# Patient Record
Sex: Female | Born: 1950
Health system: Southern US, Community
[De-identification: ages and names within clinical notes are randomized; demographics above are authoritative.]

## PROBLEM LIST (undated history)

## (undated) DIAGNOSIS — M810 Age-related osteoporosis without current pathological fracture: Secondary | ICD-10-CM

## (undated) DIAGNOSIS — Z973 Presence of spectacles and contact lenses: Secondary | ICD-10-CM

## (undated) DIAGNOSIS — F419 Anxiety disorder, unspecified: Secondary | ICD-10-CM

## (undated) DIAGNOSIS — C50411 Malignant neoplasm of upper-outer quadrant of right female breast: Secondary | ICD-10-CM

## (undated) DIAGNOSIS — I10 Essential (primary) hypertension: Secondary | ICD-10-CM

## (undated) DIAGNOSIS — N950 Postmenopausal bleeding: Secondary | ICD-10-CM

## (undated) HISTORY — DX: Age-related osteoporosis without current pathological fracture: M81.0

---

## 1997-11-04 ENCOUNTER — Other Ambulatory Visit: Admission: RE | Admit: 1997-11-04 | Discharge: 1997-11-04 | Payer: Self-pay | Admitting: Family Medicine

## 2001-11-05 ENCOUNTER — Encounter: Payer: Self-pay | Admitting: Internal Medicine

## 2001-11-05 ENCOUNTER — Encounter: Admission: RE | Admit: 2001-11-05 | Discharge: 2001-11-05 | Payer: Self-pay | Admitting: Internal Medicine

## 2003-09-11 ENCOUNTER — Ambulatory Visit (HOSPITAL_COMMUNITY): Admission: RE | Admit: 2003-09-11 | Discharge: 2003-09-11 | Payer: Self-pay | Admitting: *Deleted

## 2009-09-14 ENCOUNTER — Other Ambulatory Visit: Admission: RE | Admit: 2009-09-14 | Discharge: 2009-09-14 | Payer: Self-pay | Admitting: Registered Nurse

## 2009-09-18 ENCOUNTER — Encounter: Admission: RE | Admit: 2009-09-18 | Discharge: 2009-09-18 | Payer: Self-pay | Admitting: Internal Medicine

## 2010-01-24 ENCOUNTER — Encounter: Payer: Self-pay | Admitting: Internal Medicine

## 2010-02-26 ENCOUNTER — Other Ambulatory Visit: Payer: Self-pay | Admitting: Internal Medicine

## 2010-03-26 ENCOUNTER — Other Ambulatory Visit: Payer: Self-pay

## 2010-04-02 ENCOUNTER — Ambulatory Visit
Admission: RE | Admit: 2010-04-02 | Discharge: 2010-04-02 | Disposition: A | Discharge status: Home / Self Care | Payer: PRIVATE HEALTH INSURANCE | Source: Ambulatory Visit | Attending: Internal Medicine | Admitting: Internal Medicine

## 2010-04-07 ENCOUNTER — Other Ambulatory Visit: Payer: Self-pay | Admitting: Internal Medicine

## 2010-04-07 DIAGNOSIS — N281 Cyst of kidney, acquired: Secondary | ICD-10-CM

## 2010-04-26 ENCOUNTER — Other Ambulatory Visit: Payer: PRIVATE HEALTH INSURANCE

## 2010-05-21 NOTE — Op Note (Signed)
NAME:  Breanna Nelson, DIVEN NO.:  [id number]   MEDICAL RECORD NO.:  [id number]                   PATIENT TYPE:  AMB   LOCATION:  ENDO                                 FACILITY:  [facility]   PHYSICIAN:  Georgiana Spinner, M.D.                 DATE OF BIRTH:  1950/06/07   DATE OF PROCEDURE:  09/11/2003  DATE OF DISCHARGE:                                 OPERATIVE REPORT   PROCEDURE:  Colonoscopy in a patient with hemoccult positivity.   ANESTHESIA:  Demerol 60, Versed 6 mg.   DESCRIPTION OF PROCEDURE:  With the patient mildly sedated in the left  lateral decubitus position, the Olympus videoscopic colonoscope was inserted  in the rectum and passed under direct vision to the cecum, identified by the  ileocecal valve and appendiceal orifice, both of which were photographed.  From this point, the colonoscope was slowly withdrawn taking circumferential  views of the colonic mucosa stopping only in the rectum which appeared  normal on direct and showed hemorrhoids on retroflexed view which may have  been the cause of the patient's positivity. The endoscope was straightened  and withdrawn.  The patient's vital signs and pulse oximetry remained  stable.  The patient tolerated the procedure well without apparent  complications.   FINDINGS:  Internal hemorrhoids, otherwise unremarkable examination.   PLAN:  Have the patient follow up with me as an outpatient.                                               Georgiana Spinner, M.D.    GMO/MEDQ  D:  09/11/2003  T:  09/11/2003  Job:  [id number]

## 2013-05-07 ENCOUNTER — Other Ambulatory Visit: Payer: Self-pay | Admitting: Radiology

## 2013-05-07 DIAGNOSIS — D0511 Intraductal carcinoma in situ of right breast: Secondary | ICD-10-CM

## 2013-05-10 ENCOUNTER — Telehealth: Payer: Self-pay | Admitting: *Deleted

## 2013-05-10 DIAGNOSIS — C50411 Malignant neoplasm of upper-outer quadrant of right female breast: Secondary | ICD-10-CM

## 2013-05-10 DIAGNOSIS — Z170 Estrogen receptor positive status [ER+]: Secondary | ICD-10-CM | POA: Insufficient documentation

## 2013-05-10 NOTE — Telephone Encounter (Signed)
Confirmed [facility] for 05/15/13 at 12N .  Instructions and contact information given.

## 2013-05-13 ENCOUNTER — Ambulatory Visit
Admission: RE | Admit: 2013-05-13 | Discharge: 2013-05-13 | Disposition: A | Discharge status: Home / Self Care | Payer: PRIVATE HEALTH INSURANCE | Source: Ambulatory Visit | Attending: Radiology | Admitting: Radiology

## 2013-05-13 ENCOUNTER — Telehealth: Payer: Self-pay | Admitting: *Deleted

## 2013-05-13 ENCOUNTER — Encounter (HOSPITAL_COMMUNITY): Payer: Self-pay

## 2013-05-13 DIAGNOSIS — D0511 Intraductal carcinoma in situ of right breast: Secondary | ICD-10-CM

## 2013-05-13 MED ORDER — GADOBENATE DIMEGLUMINE 529 MG/ML IV SOLN
11.0000 mL | Freq: Once | INTRAVENOUS | Status: AC | PRN
  Administered 2013-05-13: 11 mL via INTRAVENOUS

## 2013-05-13 NOTE — Telephone Encounter (Signed)
Called and spoke with patient and confirmed new appointment time of 1230pm.  Informed her she would see the surgeon and radiation oncologist and then the medical oncologist at a later date.  She informed me she has [employer] and was just informed that this appointment is out of network.  Explained I would look into this and call her back.

## 2013-05-15 ENCOUNTER — Encounter: Payer: Self-pay | Admitting: Radiation Oncology

## 2013-05-15 ENCOUNTER — Encounter: Payer: Self-pay | Admitting: *Deleted

## 2013-05-15 ENCOUNTER — Ambulatory Visit: Payer: PRIVATE HEALTH INSURANCE

## 2013-05-15 ENCOUNTER — Ambulatory Visit
Admission: RE | Admit: 2013-05-15 | Discharge: 2013-05-15 | Disposition: A | Discharge status: Home / Self Care | Payer: PRIVATE HEALTH INSURANCE | Source: Ambulatory Visit | Attending: Radiation Oncology | Admitting: Radiation Oncology

## 2013-05-15 ENCOUNTER — Ambulatory Visit (HOSPITAL_BASED_OUTPATIENT_CLINIC_OR_DEPARTMENT_OTHER): Payer: 59 | Admitting: Surgery

## 2013-05-15 ENCOUNTER — Encounter (INDEPENDENT_AMBULATORY_CARE_PROVIDER_SITE_OTHER): Payer: Self-pay | Admitting: Surgery

## 2013-05-15 ENCOUNTER — Other Ambulatory Visit: Payer: PRIVATE HEALTH INSURANCE

## 2013-05-15 DIAGNOSIS — C50919 Malignant neoplasm of unspecified site of unspecified female breast: Secondary | ICD-10-CM

## 2013-05-15 DIAGNOSIS — C50411 Malignant neoplasm of upper-outer quadrant of right female breast: Secondary | ICD-10-CM

## 2013-05-15 DIAGNOSIS — C50911 Malignant neoplasm of unspecified site of right female breast: Secondary | ICD-10-CM

## 2013-05-15 NOTE — Patient Instructions (Signed)
Office will call to set up surgery Sentinel Lymph Node Biopsy Sentinel lymph node biopsy is a procedure in which a single lymph node is identified, removed, and examined for cancer. Lymph nodes are collections of tissue that help filter infections, cancer cells, and other waste substances from the bloodstream. Certain types of cancer can spread to nearby lymph nodes. The cancer spreads to one lymph node first, and then to others. The first lymph node that your cancer could spread to is called the sentinel lymph node. Examining the sentinel lymph node for cancer can help your caregiver plan future treatment for you. LET YOUR CAREGIVER KNOW ABOUT:   Allergies to food or medicine.  Medicines taken, including vitamins, herbs, eyedrops, over-the-counter medicines, and creams.  Use of steroids (by mouth or creams).  Previous problems with numbing medicines.  History of bleeding problems or blood clots.  Previous surgery.  Other health problems, including diabetes and kidney problems.  Possibility of pregnancy, if this applies. RISKS AND COMPLICATIONS   Infection.  Bleeding.  Allergic reaction to the dye used for the procedure.  Blue staining of the skin where the dye is injected.  Damaged lymph vessels, causing a buildup of fluid (lymphedema).  Pain or bruising at the biopsy site. BEFORE THE PROCEDURE   Stop smoking at least 2 weeks before the procedure. Not smoking will improve your health after the procedure and decrease the chance of getting a wound infection.  You may have blood tests to make sure your blood clots normally.  Ask your caregiver about changing or stopping your regular medicines.  Do not eat or drink anything for 8 hours before the procedure. PROCEDURE   You will be given medicine that makes you sleep (general anesthetic).  A blue, radioactive dye will be injected near the tumor. The dye will then spread into the sentinel lymph node.  A scanner will  identify the sentinel lymph node.  A small cut (incision) will be made, and the sentinel lymph node will be removed.  The sentinel lymph node will be examined in a lab. Sometimes, a sentinel lymph node biopsy is performed during another surgery, such as a mastectomy or lumpectomy for breast cancer.  AFTER THE PROCEDURE   You will go to a recovery room.  You will be monitored for several hours.  If complications do not occur, you will be allowed to go home a few hours after the procedure.  Your urine may be blue for the next 24 hours. This is normal. It is caused by the dye used during the procedure.  Your skin where the dye was injected may be blue for up to 8 weeks. Document Released: 03/14/2011 Document Reviewed: 03/14/2011 [facility] Patient Information 2014 [place].  Lumpectomy A lumpectomy is a form of "breast conserving" or "breast preservation" surgery. It may also be referred to as a partial mastectomy. During a lumpectomy, the portion of the breast that contains the cancerous tumor or breast mass (the lump) is removed. Some normal tissue around the lump may also be removed to make sure all the tumor has been removed. This surgery should take 40 minutes or less. LET [facility] CARE PROVIDER KNOW ABOUT:  Any allergies you have.  All medicines you are taking, including vitamins, herbs, eye drops, creams, and over-the-counter medicines.  Previous problems you or members of your family have had with the use of anesthetics.  Any blood disorders you have.  Previous surgeries you have had.  Medical conditions you have. RISKS  AND COMPLICATIONS Generally, this is a safe procedure. However, as with any procedure, complications can occur. Possible complications include:  Bleeding.  Infection.  Pain.  Temporary swelling.  Change in the shape of the breast, particularly if a large portion is removed. BEFORE THE PROCEDURE  Ask your health care provider about changing  or stopping your regular medicines.  Do not eat or drink anything for 7 8 hours before the surgery or as directed by your health care provider. Ask your health care provider if you can take a sip of water with any approved medicines.  On the day of surgery, your healthcare provider will use a mammogram or ultrasound to locate and mark the tumor in your breast. These markings on your breast will show where the cut (incision) will be made. PROCEDURE   An IV tube will be put into one of your veins.  You may be given medicine to help you relax before the surgery (sedative). You will be given one of the following:  A medicine that numbs the area (local anesthesia).  A medicine that makes you go to sleep (general anesthesia).  Your health care provider will use a kind of electric scalpel that uses heat to minimize bleeding (electrocautery knife).  A curved incision (like a smile or frown) that follows the natural curve of your breast is made, to allow for minimal scarring and better healing.  The tumor will be removed with some of the surrounding tissue. This will be sent to the lab for analysis. Your health care provider may also remove your lymph nodes at this time if needed.  Sometimes, but not always, a rubber tube called a drain will be surgically inserted into your breast area or armpit to collect excess fluid that may accumulate in the space where the tumor was. This drain is connected to a plastic bulb on the outside of your body. This drain creates suction to help remove the fluid.  The incisions will be closed with stitches (sutures).  A bandage may be placed over the incisions. AFTER THE PROCEDURE  You will be taken to the recovery area.  You will be given medicine for pain.  A small rubber drain may be placed in the breast for 2 3 days to prevent a collection of blood (hematoma) from developing in the breast. You will be given instructions on caring for the drain before you go  home.  A pressure bandage (dressing) will be applied for 1 2 days to prevent bleeding. Ask your health care provider how to care for your bandage at home. Document Released: 01/31/2006 Document Revised: 08/22/2012 Document Reviewed: 05/25/2012 [facility] Patient Information 2014 [place].

## 2013-05-15 NOTE — Progress Notes (Signed)
Patient ID: Breanna Nelson, female   DOB: February 22, 1950, 62 y.o.   MRN: [id number]  No chief complaint on file.   HPI Breanna Nelson is a 63 y.o. female.  Pt sent at the request of Dr Breanna Nelson for right breast cancer.  Pt found to have mammographic abnormality on mammogram and core BX showed IDC/DCIS 1.8 cm maximal diameter. ER/PR positive her 2 neu negative. HPI  Past Medical History  Diagnosis Date  . Osteoporosis     History reviewed. No pertinent past surgical history.  Family History  Problem Relation Age of Onset  . Testicular cancer Brother     Social History History  Substance Use Topics  . Smoking status: Never Smoker   . Smokeless tobacco: Not on file  . Alcohol Use: No    No Known Allergies  No current outpatient prescriptions on file.   No current facility-administered medications for this visit.    Review of Systems Review of Systems  Constitutional: Negative for fever, chills and unexpected weight change.  HENT: Negative for congestion, hearing loss, sore throat, trouble swallowing and voice change.   Eyes: Negative for visual disturbance.  Respiratory: Negative for cough and wheezing.   Cardiovascular: Negative for chest pain, palpitations and leg swelling.  Gastrointestinal: Negative for nausea, vomiting, abdominal pain, diarrhea, constipation, blood in stool, abdominal distention and anal bleeding.  Genitourinary: Negative for hematuria, vaginal bleeding and difficulty urinating.  Musculoskeletal: Negative for arthralgias.  Skin: Negative for rash and wound.  Neurological: Negative for seizures, syncope and headaches.  Hematological: Negative for adenopathy. Does not bruise/bleed easily.  Psychiatric/Behavioral: Negative for confusion.    There were no vitals taken for this visit.  Physical Exam Physical Exam  Constitutional: [name], place, and time. She appears well-developed and well-nourished.  HENT:  Head: Normocephalic.    Mouth/Throat: No oropharyngeal exudate.  Eyes: Pupils are equal, round, and reactive to light. No scleral icterus.  Neck: Normal range of motion. Neck supple.  Cardiovascular: Normal rate and regular rhythm.   Pulmonary/Chest: Effort normal and breath sounds normal. Right breast exhibits no inverted nipple, no mass, no nipple discharge, no skin change and no tenderness. Left breast exhibits no inverted nipple, no mass, no nipple discharge, no skin change and no tenderness. Breasts are symmetrical.    Musculoskeletal: Normal range of motion.  Lymphadenopathy:    She has no cervical adenopathy.  Neurological: She is alert and oriented to person, place, and time.  Skin: Skin is warm and dry.  Psychiatric: She has a normal mood and affect. Her behavior is normal. Judgment and thought content normal.    Data Reviewed CLINICAL DATA: New diagnosis of invasive mammary carcinoma in the  10 o'clock location of the right breast on ultrasound-guided core  biopsy of a palpable mass.  LABS: None BUN and creatinine were obtained on site at [facility] at [address]Results: BUN 9 mg/dL, Creatinine 0.7  mg/dL.  EXAM:  BILATERAL BREAST MRI WITH AND WITHOUT CONTRAST  TECHNIQUE:  Multiplanar, multisequence MR images of both breasts were obtained  prior to and following the intravenous administration of 47ml of  MultiHance.  THREE-DIMENSIONAL MR IMAGE RENDERING ON INDEPENDENT WORKSTATION:  Three-dimensional MR images were rendered by post-processing of the  original MR data on an independent workstation. The  three-dimensional MR images were interpreted, and findings are  reported in the following complete MRI report for this study. Three  dimensional images were evaluated at the independent DynaCad  workstation  COMPARISON: Previous exams from [facility]:  Breast composition: d. Extreme fibroglandular tissue.  Background parenchymal enhancement: Marked   Right breast: Within the upper-outer quadrant of the right breast,  there are several small nodules of enhancement associated with small  post biopsy hematoma and clip. The most discrete nodule shows rapid  wash-in and washout type enhancement. The area of enhancement and  hematoma together measure 1.8 x 1.7 x 1.3 cm.  Left breast: No mass or abnormal enhancement.  Lymph nodes: No suspicious lymph nodes identified.  Ancillary findings: None.  IMPRESSION:  1. Small area of enhancement in the upper-outer quadrant of the  right breast associated with the area of known malignancy.  2. No suspicious abnormality identified in the left breast.  RECOMMENDATION:  Treatment plan is recommended.  BI-RADS CATEGORY 6: Known biopsy-proven malignancy.  Electronically Signed  By: Shon Hale M.D.  On: 05/13/2013 12:54   Assessment    Stage 1 right breast cancer ER/PR positive her 2 neu negative    Plan    Pt desires breast conservation for her stage 1 right breast cancer.Discussed seed localized lumpectomy and SLN mapping.  Discussed mastectomy and reconstruction.The procedure has been discussed with the patient. Alternatives to surgery have been discussed with the patient.  Risks of surgery include bleeding,  Infection,  Seroma formation, death,  and the need for further surgery.   The patient understands and wishes to proceed.Sentinel lymph node mapping and dissection has been discussed with the patient.  Risk of bleeding,  Infection,  Seroma formation,  Additional procedures,,  Shoulder weakness ,  Shoulder stiffness,  Nerve and blood vessel injury and reaction to the mapping dyes have been discussed.  Alternatives to surgery have been discussed with the patient.  The patient agrees to proceed.       Breanna Nelson A. Rochel Privett 05/15/2013, 2:31 PM

## 2013-05-15 NOTE — Progress Notes (Signed)
Breanna Nelson Psychosocial Distress Screening Clinical Social Work  Clinical Social Work was referred by distress screening protocol.  The patient scored a 5 on the Psychosocial Distress Thermometer which indicates moderate distress. Clinical Social Worker met with patient in [facility] to assess for distress and other psychosocial needs.  Patient was accompanied by her daughter.  She states she was very distressed prior to meeting with multidisciplinary team, but feels much better after discussing treatment team.  CSW reviewed information support programs and encouraged patient to utilize [employer], breast cancer support groups, yoga/tai chi, and support services.  Patient is interested in programs but would like to process options at this time.  CSW encouraged patient or daughter to call with any additional questions or concerns.   Clinical Social Worker follow up needed: no  If yes, follow up plan:   Polo Riley, MSW, LCSW, OSW-C Clinical Social Worker [facility] [phone]

## 2013-05-15 NOTE — Progress Notes (Signed)
Radiation Oncology         [phone]) [phone] ________________________________  Initial outpatient Consultation  Name: Breanna Nelson MRN: [id number]  Date: 05/15/2013  DOB: Jun 03, 1950  JE:HUDJ,SHFWYOV, MD  Cornett, Joyice Faster., MD   REFERRING PHYSICIAN: Cornett, Joyice Faster., MD  DIAGNOSIS: Breast cancer of upper-outer quadrant of right female breast   Primary site: Breast (Right)   Staging method: AJCC 7th Edition   Clinical: Stage IA (T1c, N0, cM0)   Summary: Stage IA (T1c, N0, cM0)   Clinical comments: Stage at breast conference 05/15/13  HISTORY OF PRESENT ILLNESS::Breanna Nelson is a 63 y.o. female who is seen out courtesy of Dr. [name] as part of the multidisciplinary breast clinic. Earlier this year on self examination the patient palpated a nodule in the upper-outer aspect of her right breast. She was seen by her primary care physician and this area was not palpable however on imaging studies architectural distortion was noted in the upper outer quadrant of the right breast. On ultrasound there was a 1 cm irregular mass in the 10:00 position of the right breast. The patient proceeded to undergo biopsy of this area with pathology returning invasive mammary carcinoma likely low grade. There was some associated in situ carcinoma. Tumor was HER-2/neu negative estrogen and progesterone receptors positive. Subsequent MRI of the of the breast area showed a small area of enhancement in the upper outer quadrant of the right breast with questionable small nodules in the surrounding area of enhancement.  With this information the patient is now seen in the multidisciplinary breast clinic.Marland Kitchen  PREVIOUS RADIATION THERAPY: No  PAST MEDICAL HISTORY:  has a past medical history of Osteoporosis.    PAST SURGICAL HISTORY:History reviewed. No pertinent past surgical history.  FAMILY HISTORY: family history includes Testicular cancer in her brother.  SOCIAL HISTORY:  reports that she has never smoked.  She does not have any smokeless tobacco history on file. She reports that she does not drink alcohol or use illicit drugs. she lives by herself in the [place] area. Patient cleans houses on a part-time basis. She previously worked with Erlene Quan years ago in  [place].   she is accompanied by her daughter on evaluation today.  ALLERGIES: Review of patient's allergies indicates no known allergies.  MEDICATIONS: The patient is on no prescription medications. She does take vitamin C calcium and D3 supplement No current outpatient prescriptions on file.   No current facility-administered medications for this encounter.    REVIEW OF SYSTEMS:  A 15 point review of systems is documented in the electronic medical record. This was obtained by the nursing staff. However, I reviewed this with the patient to discuss relevant findings and make appropriate changes.  Patient palpated the area of concern on self examination. She denies any actual pain nipple discharge or bleeding prior to biopsy.   she denies any new bony pain headaches dizziness or blurred vision.  PHYSICAL EXAM: Gen. this is a very pleasant 63 year old female in no acute distress.  examination of the oral cavity reveals no mucosal lesion. The teeth are in good repair. Examination of the neck and supraclavicular region reveals no evidence of adenopathy. The axillary areas are free of adenopathy. Examination of the lungs reveals them to be clear. The heart has regular rhythm and rate. Summation of the left breast reveals no mass or nipple discharge. Examination of the right breast reveals some bruising in the upper-outer quadrant. No dominant mass appreciated in the breast nipple discharge or bleeding.  ECOG = 0  0 - Asymptomatic (Fully active, able to carry on all predisease activities without restriction)  1 - Symptomatic but completely ambulatory (Restricted in physically strenuous activity but ambulatory and able to carry out work of a  light or sedentary nature. For example, light housework, office work)  2 - Symptomatic, <50% in bed during the day (Ambulatory and capable of all self care but unable to carry out any work activities. Up and about more than 50% of waking hours)  3 - Symptomatic, >50% in bed, but not bedbound (Capable of only limited self-care, confined to bed or chair 50% or more of waking hours)  4 - Bedbound (Completely disabled. Cannot carry on any self-care. Totally confined to bed or chair)  5 - Death   Eustace Pen MM, Creech RH, Tormey DC, et al. [phone]). "Toxicity and response criteria of the [facility] Group". [place] Oncol. 5 (6): 649-55  LABORATORY DATA:  No results found for this basename: WBC, HGB, HCT, MCV, PLT, NEUTROABS   No results found for this basename: NA, K, CL, CO2, GLUCOSE,  BUN, CREATININE, CALCIUM      RADIOGRAPHY: Mr Breast Bilateral W Wo Contrast  05/13/2013   CLINICAL DATA:  New diagnosis of invasive mammary carcinoma in the 10 o'clock location of the right breast on ultrasound-guided core biopsy of a palpable mass.  LABS:  None BUN and creatinine were obtained on site at [facility] at [address]Results: BUN 9 mg/dL, Creatinine 0.7 mg/dL.  EXAM: BILATERAL BREAST MRI WITH AND WITHOUT CONTRAST  TECHNIQUE: Multiplanar, multisequence MR images of both breasts were obtained prior to and following the intravenous administration of 45m of MultiHance.  THREE-DIMENSIONAL MR IMAGE RENDERING ON INDEPENDENT WORKSTATION:  Three-dimensional MR images were rendered by post-processing of the original MR data on an independent workstation. The three-dimensional MR images were interpreted, and findings are reported in the following complete MRI report for this study. Three dimensional images were evaluated at the independent DynaCad workstation  COMPARISON:  Previous exams from [place] Breast composition: d.  Extreme fibroglandular tissue.   Background parenchymal enhancement: Marked  Right breast: Within the upper-outer quadrant of the right breast, there are several small nodules of enhancement associated with small post biopsy hematoma and clip. The most discrete nodule shows rapid wash-in and washout type enhancement. The area of enhancement and hematoma together measure 1.8 x 1.7 x 1.3 cm.  Left breast: No mass or abnormal enhancement.  Lymph nodes: No suspicious lymph nodes identified.  Ancillary findings:  None.  IMPRESSION: 1. Small area of enhancement in the upper-outer quadrant of the right breast associated with the area of known malignancy. 2. No suspicious abnormality identified in the left breast.  RECOMMENDATION: Treatment plan is recommended.  BI-RADS CATEGORY  6: Known biopsy-proven malignancy.   Electronically Signed   By: BShon HaleM.D.   On: 05/13/2013 12:54      IMPRESSION: Breast cancer of upper-outer quadrant of right female breast    Primary site: Breast (Right)   Staging method: AJCC 7th Edition   Clinical: Stage IA (T1c, N0, cM0)   Summary: Stage IA (T1c, N0, cM0)    The patient's tumor appears to be low-grade on initial biopsy, ER/PR positive, HER-2/neu negative. She would be an excellent candidate for breast conservation therapy with lumpectomy, sentinel node procedure and radiation therapy. I briefly discussed mastectomy for local regional management but  the patient at this time is interested in  breast conservation therapy. I discussed treatment course side effects and potential toxicities of radiation therapy in this situation with the patient and her daughter. She appears to understand and wishes to proceed with planned course of radiation treatment. She appears to be leaning towards her treatment here in [place] rather than [place].  She would appear to be a candidate for hypofractionated accelerated course of treatment.   PLAN: Patient will meet with Gen. surgery later this afternoon and will likely  proceed with planning for her breast conservation therapy.  I spent 45 minutes minutes face to face with the patient and more than 50% of that time was spent in counseling and/or coordination of care.   ------------------------------------------------  Blair Promise, PhD, MD

## 2013-05-16 ENCOUNTER — Telehealth (INDEPENDENT_AMBULATORY_CARE_PROVIDER_SITE_OTHER): Payer: Self-pay

## 2013-05-16 NOTE — Telephone Encounter (Signed)
Called pt to let her know that Dr Brantley Stage would be calling the insurance company  on Friday. Pt verbalized understanding and was very grateful for everyone's help

## 2013-05-16 NOTE — Telephone Encounter (Signed)
Dr Brantley Stage had asked the pt to call her insurance to file a appeal because he is not in network for pts insurance. They have called insurance to file an appeal and they informed her that Dr Brantley Stage would need to call and file this appeal and then everything should go through. Dr Brantley Stage would need to call Clinical Intake Team [phone]. Pts family would like someone to call her to let them know if this is something that Dr Brantley Stage would/could do so they will know how they need to proceed with surgery plans.

## 2013-05-16 NOTE — Telephone Encounter (Signed)
Will call friday

## 2013-05-21 ENCOUNTER — Telehealth (INDEPENDENT_AMBULATORY_CARE_PROVIDER_SITE_OTHER): Payer: Self-pay

## 2013-05-21 ENCOUNTER — Telehealth: Payer: Self-pay | Admitting: *Deleted

## 2013-05-21 NOTE — Telephone Encounter (Signed)
I have advised pts daughter of the attached msg from Dr Brantley Stage. She states she understands and will call her insurance carrier to determine MD in network.

## 2013-05-21 NOTE — Telephone Encounter (Signed)
Spoke to daughter concerning [place] from 05/15/13.  Received information that pt will need to find surgeon "in network" for her insurance.  Daughter relate they will still have radiation at [facility] with Dr. Sondra Come.  Informed daughter that once a surgeon has been found to please let us know d/t we will need to request records.  Received verbal understanding.  Encourage daughter to call with questions or concerns.  Contact information given.

## 2013-05-21 NOTE — Telephone Encounter (Signed)
REVIEWED CASE.  Will need to find provider in allowed network.  Did not call but research into matter reveals that they will need to go to provider in their network.  The other option is pay out of pocket but not advisable.  The only allowance is if this is not covered in their plan. It is but we are not on the list to provide.  No exception can be made.  They will need to see who is in their network.

## 2013-05-21 NOTE — Telephone Encounter (Signed)
Pts daughter Lattie Haw called wanting to know if Dr Brantley Stage spoke with pts insurance company. Her mother is waiting to set up surgery until the insurance coverage is determined. I advised daughter this msg will be sent to Dr Brantley Stage and to expect a call back from one of our insurance dept personal. She can be reached at [phone].

## 2013-06-03 ENCOUNTER — Telehealth: Payer: Self-pay | Admitting: *Deleted

## 2013-06-03 NOTE — Telephone Encounter (Signed)
Received call from patient stating she has found a surgeon in her network, Dr. [name] at [facility].  She states she would like to get her radiation here with Dr. Sondra Come.  Instructed her to call me back when she has a surgery date and I would refer her for an appointment to Dr. Sondra Come.  Also faxed office notes to [phone] at Dr. Brand Males office.

## 2013-06-10 ENCOUNTER — Telehealth: Payer: Self-pay | Admitting: *Deleted

## 2013-06-10 NOTE — Telephone Encounter (Signed)
Received a call from patient stating her surgery is scheduled for 06/13/13 with Dr. [name] with [facility].

## 2013-06-13 DIAGNOSIS — R58 Hemorrhage, not elsewhere classified: Secondary | ICD-10-CM | POA: Insufficient documentation

## 2013-06-13 HISTORY — PX: SIMPLE MASTECTOMY W/ SENTINEL NODE BIOPSY: SHX2410

## 2013-06-14 DIAGNOSIS — Z9010 Acquired absence of unspecified breast and nipple: Secondary | ICD-10-CM | POA: Insufficient documentation

## 2013-06-26 ENCOUNTER — Encounter: Payer: Self-pay | Admitting: Radiation Oncology

## 2013-06-26 NOTE — Progress Notes (Addendum)
Location of Breast Cancer: Breast cancer of upper-outer quadrant of right female breast  Histology per Pathology Report:   05/06/13  06/28/13 DIAGNOSIS  1. BREAST, RIGHT, SIMPLE MASTECTOMY:  LOBULAR CARCINOMA.  ONE INTRAMAMMARY LYMPH NODE CONTAINS METASTATIC LOBULAR CARCINOMA.  LOBULAR CARCINOMA IN SITU.  SEE TEMPLATE.  (RB1[MNS])  2. SENTINEL LYMPH NODE, RIGHT AXILLA, BIOPSY:  ONE (1) LYMPH NODE IS NEGATIVE FOR MALIGNANCY.  CONFIRMED BY IMMUNOPEROXIDASE STAIN FOR CYTOKERATIN.  (RB1[MNS])  3. LYMPH NODES, RIGHT AXILLARY REGION, DISSECTION:  FOUR (4) LYMPH NODES ARE NEGATIVE FOR MALIGNANCY, INCLUDED IN  TEMPLATE.  CONFIRMED BY IMMUNOPEROXIDASE STAINS FOR CYTOKERATIN.  (NKS[MNS])    Receptor Status: ER(Positive 90-100%), PR (positive 90-100%), Her2-neu (negative)  Did patient present with symptoms (if so, please note symptoms) or was this found on screening mammography?: Patientpalpated a nodule in the upper-outer aspect of her right breast.  Past/Anticipated interventions by surgeon, if any: RIGHT BREAST MASTECTOMY WITH SENTINEL NODE BIOPSY  On 06/13/13 by Dr. [name].  Patient reports that she had bleeding after the surgery and had to be taken for another surgery to stop the bleeding.  Past/Anticipated interventions by medical oncology, if any: no  Lymphedema issues, if any:  no    Pain issues, if any:   Her right chest is sore and has numbness under her right arm.  SAFETY ISSUES:  Prior radiation? no  Pacemaker/ICD? no  Possible current pregnancy?no  Is the patient on methotrexate? no  Current Complaints / other details:  Patient is her with her granddaughter.  She has 2 children.  She was 12 with her first menses.  She was 20 years at the birth of her first child.  Used BCP for a few months.  She did breast feed for about a week.    Jacqulyn Liner, RN 06/26/2013,10:39 AM

## 2013-07-03 ENCOUNTER — Ambulatory Visit
Admission: RE | Admit: 2013-07-03 | Discharge: 2013-07-03 | Disposition: A | Discharge status: Home / Self Care | Payer: Medicare PPO | Source: Ambulatory Visit | Attending: Radiation Oncology | Admitting: Radiation Oncology

## 2013-07-03 ENCOUNTER — Encounter: Payer: Self-pay | Admitting: Radiation Oncology

## 2013-07-03 VITALS — BP 154/91 | HR 72 | Temp 98.2°F | Ht 66.5 in | Wt 125.9 lb

## 2013-07-03 DIAGNOSIS — Z9010 Acquired absence of unspecified breast and nipple: Secondary | ICD-10-CM | POA: Insufficient documentation

## 2013-07-03 DIAGNOSIS — C50919 Malignant neoplasm of unspecified site of unspecified female breast: Secondary | ICD-10-CM | POA: Insufficient documentation

## 2013-07-03 DIAGNOSIS — C50411 Malignant neoplasm of upper-outer quadrant of right female breast: Secondary | ICD-10-CM

## 2013-07-03 DIAGNOSIS — Z510 Encounter for antineoplastic radiation therapy: Secondary | ICD-10-CM | POA: Diagnosis not present

## 2013-07-03 HISTORY — DX: Malignant neoplasm of upper-outer quadrant of right female breast: C50.411

## 2013-07-03 NOTE — Progress Notes (Signed)
  Radiation Oncology         [phone]) [phone] ________________________________  Name: Breanna Nelson MRN: [id number]  Date: 07/03/2013  DOB: Jul 09, 1950  Follow-Up Visit Note  CC: Tommy Medal, MD  Solon Augusta., MD  Diagnosis:  Stage IIA invasive lobular carcinoma of the right breast (pT1c, pN1, MX)   Narrative:  The patient returns today for further evaluation at the courtesy of Dr. Myrtie Soman.. She was initially seen by myself in consultation May 13 as part of the multidisciplinary breast clinic. The patient underwent her surgery at [facility] under the direction of Dr. Tressie Ellis. The patient elected to proceed with a mastectomy. She also underwent a sentinel node procedure and regional axillary node dissection. She was found to have a 1.5 cm invasive lobular carcinoma, moderately differentiated. The patient's sentinel lymph node as well as for axillary lymph nodes showed no evidence of metastatic spread. In the breast specimen however there was a metastasis in one intramammary lymph node. Postoperative recovery was complicated by development of a hematoma within the right chest wall requiring reoperation and admission to the hospital.  Patient is doing better at this time.                              ALLERGIES:  has No Known Allergies.  Meds: Current Outpatient Prescriptions  Medication Sig Dispense Refill  . Calcium 1200-1000 MG-UNIT CHEW Chew 1 capsule by mouth 2 (two) times daily.       . Cholecalciferol (VITAMIN D-3) 1000 UNITS CAPS Take 1 capsule by mouth 2 (two) times daily.       . Omega-3 Fatty Acids (FISH OIL) 500 MG CAPS Take 1 capsule by mouth 2 (two) times daily.       . vitamin C (ASCORBIC ACID) 500 MG tablet Take 500 mg by mouth daily.       No current facility-administered medications for this encounter.    Physical Findings: The patient is in no acute distress. Patient is alert and oriented.  height is 5' 6.5" (1.689 m) and weight is 125 lb 14.4 oz (57.108  kg). Her oral temperature is 98.2 F (36.8 C). Her blood pressure is 154/91 and her pulse is 72. . The palpable supraclavicular or axillary adenopathy. The lungs are clear to auscultation. The heart has regular rhythm and rate. The left breast reveals no mass or nipple discharge. The right chest wall area reveals a bandage in place over her mastectomy scar which was not removed. She also has a separate small scar in the axillary region from her sentinel node procedure/ regional node dissection.  Lab Findings: No results found for this basename: WBC, HGB, HCT, MCV, PLT      Radiographic Findings: No results found.  Impression:  Stage IIA invasive lobular carcinoma of the right breast (pT1c, pN1, MX).  I discussed general indications for postmastectomy irradiation with the patient.  She does not have any strong indicators that would suggest she may  benefit from postmastectomy radiation.  I therefore do not recommend radiation as part of her overall management.  I would however recommend the patient be evaluated for adjuvant hormonal therapy.    Plan:  The patient will be scheduled for followup consultation in medical oncology to be considered for adjuvant hormonal therapy.  ____________________________________ Blair Promise, MD

## 2013-07-03 NOTE — Progress Notes (Signed)
Please see the Nurse Progress Note in the MD Initial Consult Encounter for this patient. 

## 2013-07-09 ENCOUNTER — Telehealth: Payer: Self-pay | Admitting: *Deleted

## 2013-07-09 NOTE — Telephone Encounter (Signed)
Pt called stating that she checked with her insurance company and Dr. Marko Plume is not in her network.  She questioned what type of doctor does she need to see and I informed her that she needs to see a Futures trader.  She will call her insurance company back to see whom within [facility] is in her network and then call me back to let me know.

## 2013-07-12 ENCOUNTER — Telehealth: Payer: Self-pay | Admitting: Oncology

## 2013-07-12 NOTE — Telephone Encounter (Signed)
Breanna Nelson called and was wondering if she is going to see any doctors.  Advised her that there is a refferal in for medical oncology for her antihormone treatment.  Breanna Nelson is concerned because her insurance will only cover specific doctors.  She said Dr. Marko Plume is not covered but Dr. Skeet Latch is.  Gave her Dr. [name] name to check and she will call back on Monday.

## 2013-07-12 NOTE — Telephone Encounter (Signed)
Tequia called back and said Dr. Jana Hakim was covered by her insurance.

## 2013-07-17 ENCOUNTER — Telehealth: Payer: Self-pay | Admitting: *Deleted

## 2013-07-17 NOTE — Telephone Encounter (Signed)
Enid Derry informed me that Dr. Jana Hakim is in pts network and would like to see him.  Called and confirmed 09/02/13 appt w/ pt.  Mailed before appt letter, welcoming packet & intake form to pt.  Called [facility] in [place] to make her aware.

## 2013-08-19 ENCOUNTER — Encounter: Payer: Self-pay | Admitting: *Deleted

## 2013-08-19 ENCOUNTER — Other Ambulatory Visit: Payer: Self-pay | Admitting: *Deleted

## 2013-08-19 DIAGNOSIS — C50411 Malignant neoplasm of upper-outer quadrant of right female breast: Secondary | ICD-10-CM

## 2013-08-19 NOTE — Progress Notes (Signed)
Received chart back from [facility], labs entered, added to spreadsheet and placed in Dr. [name] box.

## 2013-08-19 NOTE — Progress Notes (Signed)
Completed chart and gave to [facility] to enter labs.

## 2013-09-01 NOTE — Progress Notes (Signed)
Breanna Nelson  [phone] [phone]     ID: Breanna Nelson DOB: 1950/06/14  MR#: [id number]  YBW#:[id number]  Patient Care Team: Tommy Medal, MD as PCP - General (Internal Medicine) Chauncey Cruel, MD as Consulting Physician (Oncology) Joyice Faster. Cornett, MD as Consulting Physician (General Surgery) Blair Promise, MD as Consulting Physician (Radiation Oncology) Dr [name], surgery  CHIEF COMPLAINT: Estrogen receptor positive breast cancer  CURRENT TREATMENT: To start tamoxifen   BREAST CANCER HISTORY: The patient herself noted a lump in her right breast late April 20 15th. She brought it to her primary care physician's attention and on 05/06/2013 had a bilateral mammography with tomography at [facility]. The breast density was category C. There was a new area of architectural distortion at the 11:00 position of the right breast with no other significant findings. Accordingly a right breast ultrasound was performed the same day. This identified a 1 cm irregular mass in the right breast at the 10:00 position. This correlated well with the palpable findings and biopsy of the mass was obtained the same day. This showed (SAA [phone]) and invasive mammary carcinoma, with strong diffuse expression of E-cadherin both an invasive and in situ carcinomas, supporting a ductal etiology. Estrogen receptor was 100% positive, progesterone receptor was 96% positive, with strong staining intensity, the MIB-1 was 3%, and there was no HER-2 amplification, the signals ratio being 1.3 and the number per cell 2.5.  The patient underwent bilateral breast MRI 05/13/2013. This confirmed, in the upper outer quadrant of the right breast, several small nodules, associated with an area of postbiopsy hematoma. Taking together the area of enhancement and hematoma the largest measuring was 1.8 cm. There were no masses or abnormal enhancement seen in the left breast and no suspicious  lymph nodes.  The patient was seen in the multidisciplinary clinic by surgery and radiation oncology 05/15/2013. After discussion here and also with Dr. [name], and with a good understanding of the equivalent results between mastectomy and breast conservation surgery plus radiation, the patient opted for mastectomy (without planned reconstruction) and sentinel lymph node sampling. This was performed 06/13/2013 at [facility]. There was some postoperative bleeding requiring evacuation of a hematoma. The final pathology ([id number]) showed a 1.5 cm invasive mammary carcinoma (either at lobular breast cancer with the unusual feature of being E-cadherin positive, or a ductal breast cancer with lobular features), grade 2, with a positive intramammary lymph node. The sentinel lymph node and 4 additional axillary lymph nodes were negative. The tumor was again strongly estrogen and progesterone receptor positive, again HER-2 negative by FISH, with a low MIB-1-1, not read as 10%. Margins were negative.  On 07/03/2013 the patient was reevaluated by radiation oncology at they agreed that postmastectomy radiation was not indicated. They suggested consultation to medical oncology to consider adjuvant hormonal therapy.  The patient's subsequent history is as detailed below.  INTERVAL HISTORY: "Breanna Nelson" was evaluated in the breast clinic 09/02/2013. She came by herself. She was seen by surgery and radiation oncology here May 15 2013, but it turned out the surgeon was not in her insurance plan and she had her definitive surgery under Dr. Tressie Ellis in [place]. She then saw Dr. Sondra Come again here and he felt there was no need for postmastectomy radiation in her case. She is now ready to consider systemic therapy.  REVIEW OF SYSTEMS: Breanna Nelson had significant bleeding postoperatively as described above. She didn't have significant problems with pain or fever. She has recovered  well from the surgery, and has gone back to work  (she now has "3 houses", she tells me. She is numb in the right axilla. She thinks she needs an eye exam (she has not had one in 12 years he tells me). She feels a little forgetful. She has been having sharp pains "going to her heart" and has been evaluated for this would electrocardiography. However most likely the pains are postoperative, as many of my patients complain of "shooting", or "stabbing", or "stinging" pain in the operative bed, which can be quite disturbing. Aside from this a detailed review of systems today was noncontributory  PAST MEDICAL HISTORY: Past Medical History  Diagnosis Date  . Osteoporosis   . Breast cancer of upper-outer quadrant of right female breast     PAST SURGICAL HISTORY: Past Surgical History  Procedure Laterality Date  . Simple mastectomy w/ sentinel node biopsy Right 06/13/13    by Dr. [name]    FAMILY HISTORY Family History  Problem Relation Age of Onset  . Testicular cancer Brother   . Skin cancer Mother    the patient's father died at the age of 21. The patient's mother died at the age of 39 from heart disease. She also had Alzheimer's disease. The patient had one brother who died in the age of 79 with testicular cancer. One sister died as a baby. There is no history of breast or ovarian cancer in the family.  GYNECOLOGIC HISTORY:  No LMP recorded. Patient is postmenopausal. Menarche age 36, first live birth age 11. The patient is GX P2. She does not recall when she went through menopause, but he was more than 10 years ago. She never took hormone replacement. She did take birth control pills for a brief period, with no complications  SOCIAL HISTORY:  About works at [employer]. Her husband died from complications of lymphoma earlier this year. He also had undergone a colectomy in 2004. "He had more than 200 polyps". The patient's daughter Breanna Nelson lives in [place] where she works as a Engineer, structural. She also does [employer]. Her  son Breanna Nelson is a truck driver. For financial reasons he is currently living in the patient's house together with her grandson age 65 and granddaughter age 45. There are 2 additional grandchildren and 1 step grandchild. The patient is a Psychologist, forensic.    ADVANCED DIRECTIVES: Not in place; the patient intends to name her daughter Breanna Nelson as her healthcare power of attorney.   HEALTH MAINTENANCE: History  Substance Use Topics  . Smoking status: Never Smoker   . Smokeless tobacco: Not on file  . Alcohol Use: No     Colonoscopy:  PAP:  Bone density: 09/18/2009 at [facility], shows osteoporosis (lowest T score at the right hip -2.9  Lipid panel:  No Known Allergies  Current Outpatient Prescriptions  Medication Sig Dispense Refill  . Calcium 1200-1000 MG-UNIT CHEW Chew 1 capsule by mouth 2 (two) times daily.       . Cholecalciferol (VITAMIN D-3) 1000 UNITS CAPS Take 1 capsule by mouth 2 (two) times daily.       . Omega-3 Fatty Acids (FISH OIL) 500 MG CAPS Take 1 capsule by mouth 2 (two) times daily.       . vitamin C (ASCORBIC ACID) 500 MG tablet Take 500 mg by mouth daily.       No current facility-administered medications for this visit.    OBJECTIVE: Middle-aged white woman in no acute  distress Filed Vitals:   09/02/13 1623  BP: 174/92  Pulse: 105  Temp: 98.9 F (37.2 C)  Resp: 18     Body mass index is 20.57 kg/(m^2).    ECOG FS:1 - Symptomatic but completely ambulatory  Ocular: Sclerae unicteric, pupils round and equal Ear-nose-throat: Oropharynx clear and moist Lymphatic: No cervical or supraclavicular adenopathy Lungs no rales or rhonchi, good excursion bilaterally Heart regular rate and rhythm, no murmur appreciated Abd soft, nontender, positive bowel sounds MSK no focal spinal tenderness, no upper extremity lymphedema Neuro: non-focal, well-oriented, appropriate affect Breasts: The right breast is status post mastectomy. There is no evidence of  chest wall recurrence. The right axilla is benign. Left breast is unremarkable.   LAB RESULTS:  CMP     Component Value Date/Time   NA 141 09/02/2013 1555    I No results found for this basename: SPEP,  UPEP,   kappa and lambda light chains    Lab Results  Component Value Date   WBC 6.1 09/02/2013      Chemistry      Component Value Date/Time   NA 141 09/02/2013 1555      Component Value Date/Time   CALCIUM 9.6 09/02/2013 1555       No results found for this basename: LABCA2    No components found with this basename: [id number]    No results found for this basename: INR,  in the last 168 hours  Urinalysis No results found for this basename: colorurine,  appearanceur,  labspec,  phurine,  glucoseu,  hgbur,  bilirubinur,  ketonesur,  proteinur,  urobilinogen,  nitrite,  leukocytesur    STUDIES:  Surgical Pathology Tissue Request 06/13/2013  [facility]  Result Narrative  [id number]   SUPPLEMENTAL REPORT  1. THIS IS A BREAST CANCER PROGNOSTIC PANEL ON [id number].  Rickard Patience, M.D.   BREAST CANCER PROGNOSTIC PANEL                                                                                              TEST NAME   STAINING         PERCENT         PROGNOSTIC SIGNIFICANCE             INTENSITY        POSITIVE              (AVERAGE)        (AVERAGE) ER          STRONG           90-100%         POSITIVE, FAVORABLE PR          STRONG           90-100%         POSITIVE, FAVORABLE HER2/NEU    0-1+                             PROBABLY NOT  AMPLIFIED, SENT FOR                                               FISH KI-67                        10%             LOW, FAVORABLE REFERENCE                                     RANGES TEST NAME   FAVORABLE        BORDERLINE      UNFAVORABLE ER          > OR = 1%        N/A             < 1% PR          > OR = 1%        N/A             < 1% HER2/NEU    < OR = 1.8        1.9 - 2.1       > OR = 2.2 KI-67       < OR = 20%       N/A             > 40% NOTE:       THE SPECIMEN WAS FORMALIN        BETWEEN 6 AND 72 HOURS;             IN               FIXATIVE             PARAFFIN-EMBEDDEDSECTIONS.          Rickard Patience MD  PATHOLOGIST  (SUPPLEMENTAL REPORT SIGNED 06/20/2013)  REPORTED: 2013-06-20 AT 6503   1. HER2/NEU NEGATIVE FOR AMPLIFICATION BY FISH (HER2/CEN17 RATIO =  1.5:1; 2.9 SIGNALS/CELL).   ------------------------  CYTOGENETICS:   FLUORESCENCE IN SITU HYBRIDIZATION (FISH) ANALYSIS PERFORMED AT  NEOGENOMICS AND INTERPRETED AT PATHOLOGISTS DIAGNOSTIC LABORATORIES IS  AS FOLLOWS:   ALONG WITH FLUORESCENCE IN SITU HYBRIDIZATION (FISH), AN H&E STAINED  SLIDE WAS REVIEWED BY A PATHOLOGIST TO IDENTIFY THE TARGET AREA  CONTAINING INVASIVE TUMOR. FISH ANALYSIS OF 40 INTERPHASE NUCLEI WAS  PERFORMED WITHIN THE MARKED TARGET AREA USING A DUAL-PROBE FISH ASSAY.  CONTROLS PERFORMED APPROPRIATELY. RESULTS SHOW NO EVIDENCE OF HER2  AMPLIFICATION AND A HER2/CEN17 RATIO OF 1.5:1 WITH AN AVERAGE HER2 COPY  NUMBER 2.9 SIGNALS PER CELL. THIS IS A NEGATIVE RESULT.   REFERENCE: WOLFF AC, HAMMOND MEH, HICKS DG, ET AL. RECOMMENDATIONS FOR  HUMAN EPIDERMAL GROWTH FACTOR RECEPTOR 2 TESTING IN BREAST CANCER. ARCH  PATHOL LAB MED. 2014;138(2):241-56.   NOTE: THIS HER2 FISH ASSAY WAS SCORED ON AN AUTOMATED IMAGE ANALYSIS  PLATFORM. TWO OR MORE INDEPENDENT AREAS OF TUMOR WERE ANALYZED AND THE  TECHNICAL RESULTS UNDERWENT A MANUAL TECHNOLOGIST REVIEW FOR QUALITY  CONTROL PURPOSES.    BILAL R. AHMAD MD  PATHOLOGIST  (SUPPLEMENTAL REPORT SIGNED 06/27/2013)  REPORTED: 2013-06-27 AT 1136    DIAGNOSIS   1. BREAST, RIGHT, SIMPLE MASTECTOMY:  LOBULAR CARCINOMA.  ONE INTRAMAMMARY LYMPH NODE CONTAINS  METASTATIC LOBULAR CARCINOMA.  LOBULAR CARCINOMA IN SITU.  SEE TEMPLATE.  (RB1[MNS])   2. SENTINEL LYMPH NODE, RIGHT AXILLA, BIOPSY:  ONE (1) LYMPH NODE IS  NEGATIVE FOR MALIGNANCY.  CONFIRMED BY IMMUNOPEROXIDASE STAIN FOR CYTOKERATIN.  (RB1[MNS])   3. LYMPH NODES, RIGHT AXILLARY REGION, DISSECTION:  FOUR (4) LYMPH NODES ARE NEGATIVE FOR MALIGNANCY, INCLUDED IN  TEMPLATE.  CONFIRMED BY IMMUNOPEROXIDASE STAINS FOR CYTOKERATIN.  (NKS[MNS])    TEMPLATE FOR BREAST CARCINOMA                                       PROCEDURE/SPECIMEN TYPE:            SIMPLE MASTECTOMY, SENTINEL NODE                                      BIOPSY AND ADDITIONAL LYMPH NODE                                      DISSECTION FROM RIGHT AXILLARY                                      REGION. LOCATION (LATERALITY AND QUADRANT): RIGHT BREAST, SUPERIOR ASPECT. HISTOPATHOLOGIC TYPE:               INVASIVE LOBULAR CARCINOMA, SEE                                      COMMENT. INVASIVE TUMOR SIZE:                1.5 X 1.1 X 1.5 CM IN GREATEST                                      DIMENSION. FOCALITY:                           UNIFOCAL. TUMOR GRADE (NOTTINGHAM):           MODERATELY DIFFERENTIATED, GRADE II.     TUBULE SCORE:                   3.     NUCLEAR SCORE:                  2.     MITOTIC SCORE:                  1. RESECTION MARGINS:                  FREE OF INVOLVEMENT. DISTANCE FROM CLOSEST MARGIN:       APPROXIMATELY 1.1 CM FROM THE                                      BLUE-INKED SUPERIOR ANTERIOR MARGIN. TOTAL NODES EXAMINED:               6. SENTINEL  NODES EXAMINED:            1. NODES CONTAINING METASTASES:        1 (INTRAMAMMARY LYMPH NODE). NODES WITH MACROMETS:               1. NODES WITH MICROMETS:               0. NODES WITH ITC'S:                   0. SIZE OF LARGEST METASTATIC DEPOSIT: APPROXIMATELY 2 MM. PERINODAL FAT INVOLVEMENT:          NOT APPLICABLE.  INVOLVED LYMPH                                      NODE WAS OF INTRAMAMMARY TYPE. ANGIOLYMPHATIC INVASION:            NOT DEFINITELY SEEN. INTRADUCTAL COMPONENT:              LOBULAR CARCINOMA IN SITU  WAS                                      SOMETIMES IDENTIFIED. NIPPLE INVOLVEMENT:                 NOT DEMONSTRATED. SKIN INVOLVEMENT:                   NOT DEMONSTRATED. CHEST WALL MUSCLE INVOLVEMENT:      NOT DEMONSTRATED. TREATMENT EFFECT:                   NOT APPLICABLE. SPECIAL STUDIES (INCL BREAST PANEL):ER, PR, HER-2/NEU AND KI-67 WILL BE                                      PERFORMED AND AN ADDENDUM REPORT                                      ISSUED.  IMMUNOPEROXIDASE STAINS                                      FOR CYTOKERATIN AND E-CADHERIN WERE                                      PERFORMED FOR TUMOR CLASSIFICATION                                      AND STAGING PURPOSES. TNM STAGE:                          T1C PN1 MX. AJCC STAGE GROUPING:                IIA.  COMMENT  THIS WAS A SOMEWHAT UNUSUAL CASE.  THE APPEARANCE OF THE TUMOR IS  CONSISTENT WITH THAT OF AN INVASIVE LOBULAR CARCINOMA AS WELL AS LOBULAR  CARCINOMA IN SITU.  THE TUMOR CELLS WERE STRONGLY POSITIVE FOR  E-CADHERIN.  DESPITE THIS, HOWEVER, I STILL Nelson A DIAGNOSIS OF  INVASIVE LOBULAR CARCINOMA.  ON OCCASION SUCH TUMORS MAY BE POSITIVE FOR  THIS PARTICULAR ANTIGEN.    Elmarie Mainland MD  PATHOLOGIST  (CASE SIGNED 06/19/2013)   CLINICAL INFORMATION  1. RIGHT BREAST CANCER.   SPECIMEN  1. BREAST, RT -SIMPLE MASTECTOMY  2. SENTINEL LYMPH NODE, RIGHT-EXCISION  3. AXILLARY LYMPH NODE(S)-BIOPSY   GROSS DESCRIPTION  1. Gaylord, Sheala; "RIGHT BREAST."  RECEIVED FRESH IS A 350.7 GM RIGHT  SIMPLE MASTECTOMY SPECIMEN ORIENTED WITH A SUTURE INDICATING THE AREA OF  THE AXILLARY TAIL.  THE BREAST MEASURES 22.5 CM FROM SUPERIOR TO  INFERIOR X 21 CM MEDIAL TO LATERAL X 3.1 CM SUPERIOR TO INFERIOR.  THE  ANTERIOR SURFACE OF THE BREAST DISPLAYS A 16.5 X 3.8 CM ELLIPSE OF PALE  TAN, WRINKLED SKIN THAT DISPLAYS A 2.5 X 2.3 CM TAN WRINKLED AREOLA AND  A 1.2 CM NIPPLE.  THE SUPERIOR ANTERIOR MARGIN IS INKED BLUE,  INFERIOR  ANTERIOR MARGIN INKED GREEN AND THE DEEP MARGIN IS INKED BLACK.   SECTIONING REVEALS A WHITE-TAN INDURATED LESION SURROUNDED BY  HEMORRHAGIC LOBULATED FIBROUS TISSUE.  A BIOPSY SITE IS LOCATED WITHIN  THE HEMORRHAGIC FIBROUS TISSUE.  THE LESION ITSELF MEASURES 1.5 X 1.1 X  0.7 CM.  IT IS LOCATED 1.1 CM FROM THE BLUE-INKED SUPERIOR ANTERIOR  MARGIN, 1.5 CM FROM THE GREEN-INKED INFERIOR ANTERIOR MARGIN, 1.7 CM  FROM THE BLACK-INKED MARGIN AND 2.5 CM DEEP AND SLIGHTLY LATERAL TO THE  NIPPLE.  THE REMAINING CUT SURFACES OF THE BREAST CONSIST OF YELLOW,  LOBULATED ADIPOSE TISSUE ADMIXED WITH DENSE WHITE-TAN FIBROUS TISSUE.   NO ADDITIONAL DISTINCT MASSES OR LESIONS ARE SEEN.   REPRESENTATIVE SECTIONS ARE SUBMITTED AS FOLLOWS:  A, NIPPLE.  B-E,  SECTIONS OF MASS.  F, UPPER OUTER QUADRANT.  G, LOWER OUTER QUADRANT.   H, UPPER INNER QUADRANT.  I, LOWER INNER QUADRANT.   ADDITIONAL SECTIONS:  TWO BLOCKS LABELED ADD 1A AND ADD 1B FROM SUPERIOR  MARGIN.  ONE BLOCK, ADD 1C FROM INFERIOR MARGIN. (MAP/RB1,RSB)   2. Dawes, Mande; "RIGHT SENTINEL NODE."  RECEIVED FRESH IS A 2.5 CM  PORTION OF YELLOW, LOBULATED ADIPOSE TISSUE THAT CONTAINS A 0.7 CM  BLUE-STAINED LYMPH NODE.  THE LYMPH NODE IS BISECTED AND SUBMITTED IN  ONE CASSETTE. (MAP/RB1)   3. Antos, Arrayah; "RIGHT OTHER AXILLARY TISSUE."  RECEIVED IN  FORMALIN IS A 4.5 CM AGGREGATE OF YELLOW, LOBULATED ADIPOSE TISSUE THAT  CONTAINS FOUR LYMPH NODES.  THE LYMPHOID TISSUE IS ENTIRELY SUBMITTED AS  FOLLOWS:  A, ONE INTACT LYMPH NODE.  B-D, ONE BISECTED LYMPH NODE,  EACH.  (LP) (MAP/RB1)    THE ABOVE DIAGNOSIS IS BASED ON PERFORMANCE OF THOROUGH GROSS AND/OR  MICROSCOPIC EVALUATIONS.  IMMUNOPEROXIDASE PROCEDURES WERE DEVELOPED AND  PERFORMANCE CHARACTERISTICS DETERMINED BY PATHOLOGISTS DIAGNOSTIC  LABORATORY.  NOT ALL HAVE BEEN CLEARED OR APPROVED BY THE [place] [employer]OD AND  DRUG ADMINISTRATION.    Performed by: [facility] [address] [facility], [place], [place] [address]    ASSESSMENT: 63 y.o. [place] woman status post right mastectomy and sentinel lymph node sampling 06/13/2013 for a pT2 pN1, stage IIB invasive mammary carcinoma (either an invasive ductal carcinoma with lobular features, or a lobular carcinoma that was E-cadherin strongly positive), estrogen and progesterone receptor positive, HER-2 nonamplified, with a low MIB-1  (1) there was no indication for postmastectomy radiation  (  2) tamoxifen started September 2015.  PLAN: We spent the better part of today's hour-long appointment discussing the biology of breast cancer in general, and the specifics of the patient's tumor in particular. Breanna Nelson understands there are more than 100 different types of cancer, though they're all treated differently, and that they'll have different prognoses. We went over the name of her cancer, its size, the fact that it had spread to a lymph node (although not to a lymph node in her arm.) And therefore was a stage II invasive ductal carcinoma.  We reviewed the fact that she has had excellent local treatment and does not need radiation. She has had no systemic treatment so far. We discussed chemotherapy, and that the benefit of chemotherapy in terms of reducing the risk of death from this cancer would be approximately 1.3%, and in terms of reducing the risk of relapse approximately 3% (figures from the adjuvant! Program). These are very marginal benefits. They're not motivating to the patient and I am certainly comfortable foregoing chemotherapy in this setting.  On the other hand the benefit of antiestrogen therapy is considerable. It will reduce her risk of recurrence by half. We discussed tamoxifen specifically and she has a good understanding of the possible toxicities, side effects and complications of this agent him including the risk of blood clots (she was on birth control pills for some time with no  complications) and of cancer of the uterus. She is more concerned about hot flashes and of course that is the more common symptom.  The plan then is to start tamoxifen and I am placing him a prescription today. I also wrote her a prescription for a breast prosthesis. She will see me again in early December. If she tolerates it well the plan would be to continue on that at least 2 years before considering switching to an aromatase inhibitor. The alternative of course would be to continue tamoxifen for 10 years.  Breanna Nelson was advised that her children should proceed to colonoscopy given their father's diagnosis.  The patient has a good understanding of the overall plan. She agrees with it. She knows the goal of treatment in her case is cure. She will call with any problems that may develop before her next visit here.  Chauncey Cruel, MD   09/02/2013 5:28 PM

## 2013-09-02 ENCOUNTER — Encounter: Payer: Self-pay | Admitting: Oncology

## 2013-09-02 ENCOUNTER — Other Ambulatory Visit (HOSPITAL_BASED_OUTPATIENT_CLINIC_OR_DEPARTMENT_OTHER): Payer: Commercial Managed Care - HMO

## 2013-09-02 ENCOUNTER — Telehealth: Payer: Self-pay | Admitting: Oncology

## 2013-09-02 ENCOUNTER — Ambulatory Visit (HOSPITAL_BASED_OUTPATIENT_CLINIC_OR_DEPARTMENT_OTHER): Payer: Commercial Managed Care - HMO | Admitting: Oncology

## 2013-09-02 ENCOUNTER — Ambulatory Visit: Payer: Medicare PPO

## 2013-09-02 VITALS — BP 174/92 | HR 105 | Temp 98.9°F | Resp 18 | Ht 66.0 in | Wt 127.4 lb

## 2013-09-02 DIAGNOSIS — C50419 Malignant neoplasm of upper-outer quadrant of unspecified female breast: Secondary | ICD-10-CM

## 2013-09-02 DIAGNOSIS — Z170 Estrogen receptor positive status [ER+]: Secondary | ICD-10-CM

## 2013-09-02 DIAGNOSIS — C50411 Malignant neoplasm of upper-outer quadrant of right female breast: Secondary | ICD-10-CM

## 2013-09-02 LAB — COMPREHENSIVE METABOLIC PANEL (CC13)
ALT: 17 U/L (ref 0–55)
AST: 23 U/L (ref 5–34)
Albumin: 4 g/dL (ref 3.5–5.0)
Alkaline Phosphatase: 61 U/L (ref 40–150)
Anion Gap: 10 mEq/L (ref 3–11)
BUN: 12 mg/dL (ref 7.0–26.0)
CO2: 28 mEq/L (ref 22–29)
Calcium: 9.6 mg/dL (ref 8.4–10.4)
Chloride: 103 mEq/L (ref 98–109)
Creatinine: 0.8 mg/dL (ref 0.6–1.1)
Glucose: 97 mg/dl (ref 70–140)
Potassium: 3.9 mEq/L (ref 3.5–5.1)
Sodium: 141 mEq/L (ref 136–145)
Total Bilirubin: 0.55 mg/dL (ref 0.20–1.20)
Total Protein: 7.3 g/dL (ref 6.4–8.3)

## 2013-09-02 LAB — CBC WITH DIFFERENTIAL/PLATELET
BASO%: 0.5 % (ref 0.0–2.0)
Basophils Absolute: 0 10*3/uL (ref 0.0–0.1)
EOS%: 1.8 % (ref 0.0–7.0)
Eosinophils Absolute: 0.1 10*3/uL (ref 0.0–0.5)
HCT: 42.9 % (ref 34.8–46.6)
HGB: 14.8 g/dL (ref 11.6–15.9)
LYMPH%: 31.7 % (ref 14.0–49.7)
MCH: 31.4 pg (ref 25.1–34.0)
MCHC: 34.5 g/dL (ref 31.5–36.0)
MCV: 90.9 fL (ref 79.5–101.0)
MONO#: 0.7 10*3/uL (ref 0.1–0.9)
MONO%: 11.6 % (ref 0.0–14.0)
NEUT#: 3.3 10*3/uL (ref 1.5–6.5)
NEUT%: 54.4 % (ref 38.4–76.8)
Platelets: 214 10*3/uL (ref 145–400)
RBC: 4.72 10*6/uL (ref 3.70–5.45)
RDW: 13 % (ref 11.2–14.5)
WBC: 6.1 10*3/uL (ref 3.9–10.3)
lymph#: 1.9 10*3/uL (ref 0.9–3.3)

## 2013-09-02 MED ORDER — TAMOXIFEN CITRATE 20 MG PO TABS: 20.0000 mg | ORAL_TABLET | Freq: Every day | ORAL | Status: DC

## 2013-09-02 NOTE — Telephone Encounter (Signed)
cld & spoke to pt and advised of appointment time & date-pt understood

## 2013-09-02 NOTE — Progress Notes (Signed)
Checked in new pt with no financial concerns.  I informed pt of the [facility] and gave her their flyer that shows what they assist with. I also informed pt that we work with different foundations that offer copay assistance for chemotherapy if needed. I gave her Raquel's card for any questions or concerns.

## 2013-11-11 ENCOUNTER — Other Ambulatory Visit: Payer: Self-pay | Admitting: Emergency Medicine

## 2013-11-11 DIAGNOSIS — C50411 Malignant neoplasm of upper-outer quadrant of right female breast: Secondary | ICD-10-CM

## 2013-11-11 MED ORDER — TAMOXIFEN CITRATE 20 MG PO TABS: 20.0000 mg | ORAL_TABLET | Freq: Every day | ORAL | Status: DC

## 2013-12-10 ENCOUNTER — Telehealth: Payer: Self-pay | Admitting: Oncology

## 2013-12-10 ENCOUNTER — Ambulatory Visit (HOSPITAL_BASED_OUTPATIENT_CLINIC_OR_DEPARTMENT_OTHER): Payer: Commercial Managed Care - HMO | Admitting: Oncology

## 2013-12-10 VITALS — BP 160/87 | HR 85 | Temp 98.2°F | Resp 19 | Ht 66.0 in | Wt 126.3 lb

## 2013-12-10 DIAGNOSIS — Z853 Personal history of malignant neoplasm of breast: Secondary | ICD-10-CM

## 2013-12-10 DIAGNOSIS — C50411 Malignant neoplasm of upper-outer quadrant of right female breast: Secondary | ICD-10-CM

## 2013-12-10 DIAGNOSIS — Z79810 Long term (current) use of selective estrogen receptor modulators (SERMs): Secondary | ICD-10-CM

## 2013-12-10 NOTE — Telephone Encounter (Signed)
per pof to sch pt appt-gave pt copy of sch °

## 2013-12-10 NOTE — Progress Notes (Signed)
[place]  [phone] Fax:(336) [phone]     ID: Breanna Nelson DOB: January 20, 1950  MR#: [id number]  WLN#:[id number]  Patient Care Team: Breanna Medal, MD as PCP - General (Internal Medicine) Breanna Cruel, MD as Consulting Physician (Oncology) Breanna Luna, MD as Consulting Physician (General Surgery) Breanna Promise, MD as Consulting Physician (Radiation Oncology) Breanna Nelson, surgery  CHIEF COMPLAINT: Estrogen receptor positive breast cancer  CURRENT TREATMENT:  tamoxifen   BREAST CANCER HISTORY: From the original intake note:  The patient herself noted a lump in her right breast late April 20 15th. She brought it to her primary care physician's attention and on 05/06/2013 had a bilateral mammography with tomography at [facility]. The breast density was category C. There was a new area of architectural distortion at the 11:00 position of the right breast with no other significant findings. Accordingly a right breast ultrasound was performed the same day. This identified a 1 cm irregular mass in the right breast at the 10:00 position. This correlated well with the palpable findings and biopsy of the mass was obtained the same day. This showed ([id number] [id number]) and invasive mammary carcinoma, with strong diffuse expression of E-cadherin both an invasive and in situ carcinomas, supporting a ductal etiology. Estrogen receptor was 100% positive, progesterone receptor was 96% positive, with strong staining intensity, the MIB-1 was 3%, and there was no HER-2 amplification, the signals ratio being 1.3 and the number per cell 2.5.  The patient underwent bilateral breast MRI 05/13/2013. This confirmed, in the upper outer quadrant of the right breast, several small nodules, associated with an area of postbiopsy hematoma. Taking together the area of enhancement and hematoma the largest measuring was 1.8 cm. There were no masses or abnormal enhancement seen in the left  breast and no suspicious lymph nodes.  The patient was seen in the multidisciplinary clinic by surgery and radiation oncology 05/15/2013. After discussion here and also with Breanna. Nicole Nelson, and with a good understanding of the equivalent results between mastectomy and breast conservation surgery plus radiation, the patient opted for mastectomy (without planned reconstruction) and sentinel lymph node sampling. This was performed 06/13/2013 at [facility]. There was some postoperative bleeding requiring evacuation of a hematoma. The final pathology ([id number]) showed a 1.5 cm invasive mammary carcinoma (either at lobular breast cancer with the unusual feature of being E-cadherin positive, or a ductal breast cancer with lobular features), grade 2, with a positive intramammary lymph node. The sentinel lymph node and 4 additional axillary lymph nodes were negative. The tumor was again strongly estrogen and progesterone receptor positive, again HER-2 negative by FISH, with a low MIB-1-1, not read as 10%. Margins were negative.  On 07/03/2013 the patient was reevaluated by radiation oncology at they agreed that postmastectomy radiation was not indicated. They suggested consultation to medical oncology to consider adjuvant hormonal therapy.  The patient's subsequent history is as detailed below.  INTERVAL HISTORY: "Breanna Nelson" returns today for follow-up of her breast cancer. She started tamoxifen in August 2015. She is tolerating it well and she denies problems with hot flashes. She has very minimal vaginal discharge. She is obtaining at currently at no cost  REVIEW OF SYSTEMS: Breanna Nelson is working full-time at [employer], as before. Her hands get a little chapped. Sometimes the second and third digit of the right hand gets a little numb or little white. She has a little urinary incontinence chiefly in the morning. She has nocturia at least once a night  most nights. Aside from this a detailed review of systems  today was noncontributory  PAST MEDICAL HISTORY: Past Medical History  Diagnosis Date  . Osteoporosis   . Breast cancer of upper-outer quadrant of right female breast     PAST SURGICAL HISTORY: Past Surgical History  Procedure Laterality Date  . Simple mastectomy w/ sentinel node biopsy Right 06/13/13    by Breanna. Theodoro Nelson    FAMILY HISTORY Family History  Problem Relation Age of Onset  . Testicular cancer Brother   . Skin cancer Mother    the patient's father died at the age of 20. The patient's mother died at the age of 59 from heart disease. She also had Alzheimer's disease. The patient had one brother who died in the age of 25 with testicular cancer. One sister died as a baby. There is no history of breast or ovarian cancer in the family.  GYNECOLOGIC HISTORY:  No LMP recorded. Patient is postmenopausal. Menarche age 30, first live birth age 57. The patient is GX P2. She does not recall when she went through menopause, but he was more than 10 years ago. She never took hormone replacement. She did take birth control pills for a brief period, with no complications  SOCIAL HISTORY:  About works at [employer]. Her husband died from complications of lymphoma earlier this year. He also had undergone a colectomy in 2004. "He had more than 200 polyps". The patient's daughter Breanna Nelson lives in [place] where she works as a Engineer, structural. She also does [employer]. Her son Breanna Nelson is a truck driver. For financial reasons he is currently living in the patient's house together with her grandson age 44 and granddaughter age 8. There are 2 additional grandchildren and 1 step grandchild. The patient is a Psychologist, forensic.    ADVANCED DIRECTIVES: Not in place; the patient intends to name her daughter Breanna Nelson as her healthcare power of attorney.   HEALTH MAINTENANCE: History  Substance Use Topics  . Smoking status: Never Smoker   . Smokeless tobacco: Not on file  . Alcohol Use:  No     Colonoscopy:  PAP:  Bone density: 09/18/2009 at [facility], shows osteoporosis (lowest T score at the right hip -2.9  Lipid panel:  No Known Allergies  Current Outpatient Prescriptions  Medication Sig Dispense Refill  . Calcium 1200-1000 MG-UNIT CHEW Chew 1 capsule by mouth 2 (two) times daily.     . Cholecalciferol (VITAMIN D-3) 1000 UNITS CAPS Take 1 capsule by mouth 2 (two) times daily.     . Omega-3 Fatty Acids (FISH OIL) 500 MG CAPS Take 1 capsule by mouth 2 (two) times daily.     . tamoxifen (NOLVADEX) 20 MG tablet Take 1 tablet (20 mg total) by mouth daily. 90 tablet 4  . vitamin C (ASCORBIC ACID) 500 MG tablet Take 500 mg by mouth daily.     No current facility-administered medications for this visit.    OBJECTIVE: Middle-aged white woman who appears well Filed Vitals:   12/10/13 1525  BP: 160/87  Pulse: 85  Temp: 98.2 F (36.8 C)  Resp: 19     Body mass index is 20.39 kg/(m^2).    ECOG FS:0 - Asymptomatic  Sclerae unicteric, pupils equal and reactive Oropharynx clear and moist No cervical or supraclavicular adenopathy Lungs no rales or rhonchi Heart regular rate and rhythm Abd soft, nontender, positive bowel sounds MSK no focal spinal tenderness, no upper extremity lymphedema Neuro: nonfocal, well  oriented, appropriate affect Breasts: The right breast is status post mastectomy. There is no evidence of chest wall recurrence. The right axilla is benign. The left breast is unremarkable.    LAB RESULTS:  CMP     Component Value Date/Time   NA 141 09/02/2013 1555    I No results found for: SPEP  Lab Results  Component Value Date   WBC 6.1 09/02/2013      Chemistry      Component Value Date/Time   NA 141 09/02/2013 1555      Component Value Date/Time   CALCIUM 9.6 09/02/2013 1555       No results found for: LABCA2  No components found for: [id number]  No results for input(s): INR in the last 168  hours.  Urinalysis No results found for: COLORURINE  STUDIES: No results found.   ASSESSMENT: 63 y.o. [place] woman status post right mastectomy and sentinel lymph node sampling 06/13/2013 for a pT2 pN1, stage IIB invasive mammary carcinoma (either an invasive ductal carcinoma with lobular features, or a lobular carcinoma that was E-cadherin strongly positive), estrogen and progesterone receptor positive, HER-2 nonamplified, with a low MIB-1  (1) there was no indication for postmastectomy radiation  (2) the patient opted against adjuvant chemotherapy  (3) tamoxifen started September 2015.   PLAN: Breanna Nelson is tolerating the tamoxifen well, and the plan will be to continue that for at least 2 years and then consider switching to an aromatase inhibitor, versus continuing tamoxifen for 10 years.  I again went over the fact that her 2 children need to be screened for colon cancer given their father's history of what likely is a polyposis syndrome. The patient believes her daughter has been tested but her son has not.  Dorian Pod will have her next mammogram in May. She will see [place] again in June. Assuming all is going well, we will start following her on a yearly basis thereafter. She knows to call for any problems that may develop before her next visit here.  Breanna Cruel, MD   12/10/2013 3:39 PM

## 2014-06-16 ENCOUNTER — Other Ambulatory Visit (HOSPITAL_BASED_OUTPATIENT_CLINIC_OR_DEPARTMENT_OTHER): Payer: Commercial Managed Care - HMO

## 2014-06-16 ENCOUNTER — Encounter: Payer: Self-pay | Admitting: Nurse Practitioner

## 2014-06-16 ENCOUNTER — Telehealth: Payer: Self-pay | Admitting: *Deleted

## 2014-06-16 ENCOUNTER — Other Ambulatory Visit: Payer: Self-pay | Admitting: *Deleted

## 2014-06-16 ENCOUNTER — Ambulatory Visit (HOSPITAL_BASED_OUTPATIENT_CLINIC_OR_DEPARTMENT_OTHER): Payer: Commercial Managed Care - HMO | Admitting: Nurse Practitioner

## 2014-06-16 ENCOUNTER — Telehealth: Payer: Self-pay | Admitting: Oncology

## 2014-06-16 VITALS — BP 152/86 | HR 76 | Temp 98.2°F | Resp 18 | Wt 124.8 lb

## 2014-06-16 DIAGNOSIS — Z853 Personal history of malignant neoplasm of breast: Secondary | ICD-10-CM

## 2014-06-16 DIAGNOSIS — C50411 Malignant neoplasm of upper-outer quadrant of right female breast: Secondary | ICD-10-CM

## 2014-06-16 LAB — COMPREHENSIVE METABOLIC PANEL (CC13)
ALT: 17 U/L (ref 0–55)
AST: 26 U/L (ref 5–34)
Albumin: 3.9 g/dL (ref 3.5–5.0)
Alkaline Phosphatase: 41 U/L (ref 40–150)
Anion Gap: 10 mEq/L (ref 3–11)
BUN: 9.2 mg/dL (ref 7.0–26.0)
CO2: 26 mEq/L (ref 22–29)
Calcium: 9.2 mg/dL (ref 8.4–10.4)
Chloride: 103 mEq/L (ref 98–109)
Creatinine: 0.7 mg/dL (ref 0.6–1.1)
EGFR: 86 mL/min/{1.73_m2} — ABNORMAL LOW (ref >90)
Glucose: 94 mg/dl (ref 70–140)
Potassium: 4.4 mEq/L (ref 3.5–5.1)
Sodium: 139 mEq/L (ref 136–145)
Total Bilirubin: 0.66 mg/dL (ref 0.20–1.20)
Total Protein: 6.5 g/dL (ref 6.4–8.3)

## 2014-06-16 LAB — CBC WITH DIFFERENTIAL/PLATELET
BASO%: 0.7 % (ref 0.0–2.0)
Basophils Absolute: 0 10*3/uL (ref 0.0–0.1)
EOS%: 0.9 % (ref 0.0–7.0)
Eosinophils Absolute: 0.1 10*3/uL (ref 0.0–0.5)
HCT: 45.3 % (ref 34.8–46.6)
HGB: 15.1 g/dL (ref 11.6–15.9)
LYMPH%: 27.9 % (ref 14.0–49.7)
MCH: 31.1 pg (ref 25.1–34.0)
MCHC: 33.3 g/dL (ref 31.5–36.0)
MCV: 93.3 fL (ref 79.5–101.0)
MONO#: 0.5 10*3/uL (ref 0.1–0.9)
MONO%: 8 % (ref 0.0–14.0)
NEUT#: 4.1 10*3/uL (ref 1.5–6.5)
NEUT%: 62.5 % (ref 38.4–76.8)
Platelets: 180 10*3/uL (ref 145–400)
RBC: 4.86 10*6/uL (ref 3.70–5.45)
RDW: 13.1 % (ref 11.2–14.5)
WBC: 6.6 10*3/uL (ref 3.9–10.3)
lymph#: 1.8 10*3/uL (ref 0.9–3.3)

## 2014-06-16 NOTE — Telephone Encounter (Signed)
THIS INFORMATION WAS GIVEN TO PT.

## 2014-06-16 NOTE — Telephone Encounter (Signed)
Gave avs & calendar for December. °

## 2014-06-16 NOTE — Progress Notes (Signed)
[place]  Telephone:(336) [phone] Fax:(336) [phone]     ID: Breanna Nelson DOB: 1950-03-13  MR#: [id number]  GTX#:[id number]  Patient Care Team: Tommy Medal, MD as PCP - General (Internal Medicine) Chauncey Cruel, MD as Consulting Physician (Oncology) [name], MD as Consulting Physician (General Surgery) [name], MD as Consulting Physician (Radiation Oncology) Dr [name], surgery  CHIEF COMPLAINT: Estrogen receptor positive breast cancer  CURRENT TREATMENT:  tamoxifen   BREAST CANCER HISTORY: From the original intake note:  The patient herself noted a lump in her right breast late April 20 15th. She brought it to her primary care physician's attention and on 05/06/2013 had a bilateral mammography with tomography at [facility]. The breast density was category C. There was a new area of architectural distortion at the 11:00 position of the right breast with no other significant findings. Accordingly a right breast ultrasound was performed the same day. This identified a 1 cm irregular mass in the right breast at the 10:00 position. This correlated well with the palpable findings and biopsy of the mass was obtained the same day. This showed (SAA [id number]) and invasive mammary carcinoma, with strong diffuse expression of E-cadherin both an invasive and in situ carcinomas, supporting a ductal etiology. Estrogen receptor was 100% positive, progesterone receptor was 96% positive, with strong staining intensity, the MIB-1 was 3%, and there was no HER-2 amplification, the signals ratio being 1.3 and the number per cell 2.5.  The patient underwent bilateral breast MRI 05/13/2013. This confirmed, in the upper outer quadrant of the right breast, several small nodules, associated with an area of postbiopsy hematoma. Taking together the area of enhancement and hematoma the largest measuring was 1.8 cm. There were no masses or abnormal enhancement seen in the left breast  and no suspicious lymph nodes.  The patient was seen in the multidisciplinary clinic by surgery and radiation oncology 05/15/2013. After discussion here and also with Dr. [name], and with a good understanding of the equivalent results between mastectomy and breast conservation surgery plus radiation, the patient opted for mastectomy (without planned reconstruction) and sentinel lymph node sampling. This was performed 06/13/2013 at [facility]. There was some postoperative bleeding requiring evacuation of a hematoma. The final pathology ([id number]) showed a 1.5 cm invasive mammary carcinoma (either at lobular breast cancer with the unusual feature of being E-cadherin positive, or a ductal breast cancer with lobular features), grade 2, with a positive intramammary lymph node. The sentinel lymph node and 4 additional axillary lymph nodes were negative. The tumor was again strongly estrogen and progesterone receptor positive, again HER-2 negative by FISH, with a low MIB-1-1, not read as 10%. Margins were negative.  On 07/03/2013 the patient was reevaluated by radiation oncology at they agreed that postmastectomy radiation was not indicated. They suggested consultation to medical oncology to consider adjuvant hormonal therapy.  The patient's subsequent history is as detailed below.  INTERVAL HISTORY: "Breanna Nelson" returns today for follow-up of her breast cancer. She has been on tamoxifen since September 2015 and is tolerating this drug remarkably well. She denies hot flashes and has minimal vaginal wetness. The interval history is completely unremarkable. She walks for exercise on occasion.   REVIEW OF SYSTEMS: Breanna Nelson denies fevers, chills, nausea, vomiting, or changes in bowel habits. She has some mild stress urinary in continence. Her appetite is healthy and she is drinking well. She denies shortness of breath, chest pain, cough, or palpitations. She has no headaches, dizziness, unexplained weight loss,  or  fatigue. A detailed review of systems is otherwise stable.  PAST MEDICAL HISTORY: Past Medical History  Diagnosis Date  . Osteoporosis   . Breast cancer of upper-outer quadrant of right female breast     PAST SURGICAL HISTORY: Past Surgical History  Procedure Laterality Date  . Simple mastectomy w/ sentinel node biopsy Right 06/13/13    by Dr. [name]    FAMILY HISTORY Family History  Problem Relation Age of Onset  . Testicular cancer Brother   . Skin cancer Mother    the patient's father died at the age of 61. The patient's mother died at the age of 61 from heart disease. She also had Alzheimer's disease. The patient had one brother who died in the age of 47 with testicular cancer. One sister died as a baby. There is no history of breast or ovarian cancer in the family.  GYNECOLOGIC HISTORY:  No LMP recorded. Patient is postmenopausal. Menarche age 73, first live birth age 22. The patient is GX P2. She does not recall when she went through menopause, but he was more than 10 years ago. She never took hormone replacement. She did take birth control pills for a brief period, with no complications  SOCIAL HISTORY:  About works at [employer]. Her husband died from complications of lymphoma earlier this year. He also had undergone a colectomy in 2004. "He had more than 200 polyps". The patient's daughter Breanna Nelson lives in [place] where she works as a Engineer, structural. She also does [employer]. Her son Breanna Nelson is a truck driver. For financial reasons he is currently living in the patient's house together with her grandson age 51 and granddaughter age 79. There are 2 additional grandchildren and 1 step grandchild. The patient is a Psychologist, forensic.    ADVANCED DIRECTIVES: Not in place; the patient intends to name her daughter Breanna Nelson as her healthcare power of attorney.   HEALTH MAINTENANCE: History  Substance Use Topics  . Smoking status: Never Smoker   . Smokeless  tobacco: Not on file  . Alcohol Use: No     Colonoscopy:  PAP:  Bone density: 09/18/2009 at [place], shows osteoporosis (lowest T score at the right hip -2.9  Lipid panel:  No Known Allergies  Current Outpatient Prescriptions  Medication Sig Dispense Refill  . Calcium 1200-1000 MG-UNIT CHEW Chew 1 capsule by mouth 2 (two) times daily.     . Cholecalciferol (VITAMIN D-3) 1000 UNITS CAPS Take 1 capsule by mouth 2 (two) times daily.     . Omega-3 Fatty Acids (FISH OIL) 500 MG CAPS Take 1 capsule by mouth 2 (two) times daily.     . tamoxifen (NOLVADEX) 20 MG tablet Take 1 tablet (20 mg total) by mouth daily. 90 tablet 4  . vitamin C (ASCORBIC ACID) 500 MG tablet Take 500 mg by mouth daily.     No current facility-administered medications for this visit.    OBJECTIVE: Middle-aged white woman who appears well Filed Vitals:   06/16/14 1434  BP: 152/86  Pulse:   Temp:   Resp:      Body mass index is 20.15 kg/(m^2).    ECOG FS:0 - Asymptomatic  Skin: warm, dry  HEENT: sclerae anicteric, conjunctivae pink, oropharynx clear. No thrush or mucositis.  Lymph Nodes: No cervical or supraclavicular lymphadenopathy  Lungs: clear to auscultation bilaterally, no rales, wheezes, or rhonci  Heart: regular rate and rhythm  Abdomen: round, soft, non tender, positive bowel sounds  Musculoskeletal: No focal spinal tenderness, no peripheral edema  Neuro: non focal, well oriented, positive affect  Breasts: right breast status post mastectomy. No evidence of recurrent disease. Right axilla benign. Left breast unremarkable.   LAB RESULTS:  CMP     Component Value Date/Time   NA 139 06/16/2014 1335    I No results found for: SPEP  Lab Results  Component Value Date   WBC 6.6 06/16/2014      Chemistry      Component Value Date/Time   NA 139 06/16/2014 1335      Component Value Date/Time   CALCIUM 9.2 06/16/2014 1335       No results found for: LABCA2  No  components found for: [id number]  No results for input(s): INR in the last 168 hours.  Urinalysis No results found for: COLORURINE  STUDIES: No results found. Most recent mammogram at [facility] on 06/09/14 was unremarkable.  ASSESSMENT: 64 y.o. [place] woman status post right mastectomy and sentinel lymph node sampling 06/13/2013 for a pT2 pN1, stage IIB invasive mammary carcinoma (either an invasive ductal carcinoma with lobular features, or a lobular carcinoma that was E-cadherin strongly positive), estrogen and progesterone receptor positive, HER-2 nonamplified, with a low MIB-1  (1) there was no indication for postmastectomy radiation  (2) the patient opted against adjuvant chemotherapy  (3) tamoxifen started September 2015.   PLAN: Breanna Nelson continues to do well as far as her breast cancer is concerned. Her last mammogram was benign. The labs were reviewed in detail and were entirely stable. She is tolerating the tamoxifen well and will continue this drug until at least September 2017 before discussion on switching to an aromatase inhibitor.   Breanna Nelson will return in 6 months for labs and a follow up visit with Dr. Jana Hakim. She understands and agrees with this plan. She knows the goal of treatment in her case is cure. She has been encouraged to call with any issues that might arise before her next visit here.   Laurie Panda, NP   06/16/2014 3:07 PM

## 2014-12-11 ENCOUNTER — Other Ambulatory Visit: Payer: Self-pay

## 2014-12-11 DIAGNOSIS — C50411 Malignant neoplasm of upper-outer quadrant of right female breast: Secondary | ICD-10-CM

## 2014-12-15 ENCOUNTER — Ambulatory Visit (HOSPITAL_BASED_OUTPATIENT_CLINIC_OR_DEPARTMENT_OTHER): Payer: Commercial Managed Care - HMO | Admitting: Oncology

## 2014-12-15 ENCOUNTER — Other Ambulatory Visit (HOSPITAL_BASED_OUTPATIENT_CLINIC_OR_DEPARTMENT_OTHER): Payer: Commercial Managed Care - HMO

## 2014-12-15 ENCOUNTER — Telehealth: Payer: Self-pay | Admitting: Oncology

## 2014-12-15 VITALS — BP 162/82 | HR 82 | Temp 98.7°F | Resp 18 | Ht 66.0 in | Wt 134.1 lb

## 2014-12-15 DIAGNOSIS — Z170 Estrogen receptor positive status [ER+]: Secondary | ICD-10-CM | POA: Diagnosis not present

## 2014-12-15 DIAGNOSIS — C50411 Malignant neoplasm of upper-outer quadrant of right female breast: Secondary | ICD-10-CM

## 2014-12-15 DIAGNOSIS — M810 Age-related osteoporosis without current pathological fracture: Secondary | ICD-10-CM | POA: Diagnosis not present

## 2014-12-15 LAB — CBC WITH DIFFERENTIAL/PLATELET
BASO%: 0.3 % (ref 0.0–2.0)
Basophils Absolute: 0 10*3/uL (ref 0.0–0.1)
EOS%: 0.7 % (ref 0.0–7.0)
Eosinophils Absolute: 0.1 10*3/uL (ref 0.0–0.5)
HCT: 43 % (ref 34.8–46.6)
HGB: 14.5 g/dL (ref 11.6–15.9)
LYMPH%: 18.8 % (ref 14.0–49.7)
MCH: 31.6 pg (ref 25.1–34.0)
MCHC: 33.7 g/dL (ref 31.5–36.0)
MCV: 93.7 fL (ref 79.5–101.0)
MONO#: 0.6 10*3/uL (ref 0.1–0.9)
MONO%: 7.9 % (ref 0.0–14.0)
NEUT#: 5.5 10*3/uL (ref 1.5–6.5)
NEUT%: 72.3 % (ref 38.4–76.8)
Platelets: 235 10*3/uL (ref 145–400)
RBC: 4.59 10*6/uL (ref 3.70–5.45)
RDW: 12.6 % (ref 11.2–14.5)
WBC: 7.6 10*3/uL (ref 3.9–10.3)
lymph#: 1.4 10*3/uL (ref 0.9–3.3)

## 2014-12-15 LAB — COMPREHENSIVE METABOLIC PANEL
ALT: 16 U/L (ref 0–55)
AST: 23 U/L (ref 5–34)
Albumin: 3.8 g/dL (ref 3.5–5.0)
Alkaline Phosphatase: 45 U/L (ref 40–150)
Anion Gap: 9 mEq/L (ref 3–11)
BUN: 11.6 mg/dL (ref 7.0–26.0)
CO2: 26 mEq/L (ref 22–29)
Calcium: 9.1 mg/dL (ref 8.4–10.4)
Chloride: 103 mEq/L (ref 98–109)
Creatinine: 0.8 mg/dL (ref 0.6–1.1)
EGFR: 83 mL/min/{1.73_m2} — ABNORMAL LOW (ref >90)
Glucose: 98 mg/dl (ref 70–140)
Potassium: 4.5 mEq/L (ref 3.5–5.1)
Sodium: 138 mEq/L (ref 136–145)
Total Bilirubin: 0.42 mg/dL (ref 0.20–1.20)
Total Protein: 6.9 g/dL (ref 6.4–8.3)

## 2014-12-15 MED ORDER — TAMOXIFEN CITRATE 20 MG PO TABS: 20.0000 mg | ORAL_TABLET | Freq: Every day | ORAL | Status: DC

## 2014-12-15 NOTE — Telephone Encounter (Signed)
Appointments made and avs printed for patient °

## 2014-12-15 NOTE — Progress Notes (Signed)
[place]  [phone] [phone]     ID: [name] DOB: 04-29-1950  MR#: [id number]  FTD#:[id number]  Patient Care Team: Tommy Medal, MD as PCP - General (Internal Medicine) Chauncey Cruel, MD as Consulting Physician (Oncology) [name], MD as Consulting Physician (General Surgery) [name], MD as Consulting Physician (Radiation Oncology) Dr [name], surgery  CHIEF COMPLAINT: Estrogen receptor positive breast cancer  CURRENT TREATMENT:  tamoxifen   BREAST CANCER HISTORY: From the original intake note:  The patient herself noted a lump in her right breast late April 20 15th. She brought it to her primary care physician's attention and on 05/06/2013 had a bilateral mammography with tomography at [facility]. The breast density was category C. There was a new area of architectural distortion at the 11:00 position of the right breast with no other significant findings. Accordingly a right breast ultrasound was performed the same day. This identified a 1 cm irregular mass in the right breast at the 10:00 position. This correlated well with the palpable findings and biopsy of the mass was obtained the same day. This showed (SAA [id number]) and invasive mammary carcinoma, with strong diffuse expression of E-cadherin both an invasive and in situ carcinomas, supporting a ductal etiology. Estrogen receptor was 100% positive, progesterone receptor was 96% positive, with strong staining intensity, the MIB-1 was 3%, and there was no HER-2 amplification, the signals ratio being 1.3 and the number per cell 2.5.  The patient underwent bilateral breast MRI 05/13/2013. This confirmed, in the upper outer quadrant of the right breast, several small nodules, associated with an area of postbiopsy hematoma. Taking together the area of enhancement and hematoma the largest measuring was 1.8 cm. There were no masses or abnormal enhancement seen in the left breast  and no suspicious lymph nodes.  The patient was seen in the multidisciplinary clinic by surgery and radiation oncology 05/15/2013. After discussion here and also with Dr. [name], and with a good understanding of the equivalent results between mastectomy and breast conservation surgery plus radiation, the patient opted for mastectomy (without planned reconstruction) and sentinel lymph node sampling. This was performed 06/13/2013 at [facility]. There was some postoperative bleeding requiring evacuation of a hematoma. The final pathology ([id number]) showed a 1.5 cm invasive mammary carcinoma (either at lobular breast cancer with the unusual feature of being E-cadherin positive, or a ductal breast cancer with lobular features), grade 2, with a positive intramammary lymph node. The sentinel lymph node and 4 additional axillary lymph nodes were negative. The tumor was again strongly estrogen and progesterone receptor positive, again HER-2 negative by FISH, with a low MIB-1-1, not read as 10%. Margins were negative.  On 07/03/2013 the patient was reevaluated by radiation oncology at they agreed that postmastectomy radiation was not indicated. They suggested consultation to medical oncology to consider adjuvant hormonal therapy.  The patient's subsequent history is as detailed below.  INTERVAL HISTORY: "Breanna Nelson" returns today for follow-up of her estrogen receptor positive breast cancer. She continues on tamoxifen, which she tolerates remarkably well. She does not have any problems with hot flashes. She has a very minimal vaginal discharge, not requiring a pad. She obtains a drug at no cost   REVIEW OF SYSTEMS: Breanna Nelson is still working part-time. For additional exercise she likes to take walks. Aside from some stress urinary incontinence, which is not new, a detailed review of systems today was stable  PAST MEDICAL HISTORY: Past Medical History  Diagnosis Date  .  Osteoporosis   . Breast cancer of  upper-outer quadrant of right female breast     PAST SURGICAL HISTORY: Past Surgical History  Procedure Laterality Date  . Simple mastectomy w/ sentinel node biopsy Right 06/13/13    by Dr. [name]    FAMILY HISTORY Family History  Problem Relation Age of Onset  . Testicular cancer Brother   . Skin cancer Mother    the patient's father died at the age of 35. The patient's mother died at the age of 53 from heart disease. She also had Alzheimer's disease. The patient had one brother who died in the age of 30 with testicular cancer. One sister died as a baby. There is no history of breast or ovarian cancer in the family.  GYNECOLOGIC HISTORY:  No LMP recorded. Patient is postmenopausal. Menarche age 42, first live birth age 50. The patient is GX P2. She does not recall when she went through menopause, but he was more than 10 years ago. She never took hormone replacement. She did take birth control pills for a brief period, with no complications  SOCIAL HISTORY:  About works at [employer]. Her husband died from complications of lymphoma earlier this year. He also had undergone a colectomy in 2004. "He had more than 200 polyps". The patient's daughter [name] lives in [place] where she works as a Engineer, structural. She also does [employer]. Her son Lorrene Graef is a truck driver. For financial reasons he is currently living in the patient's house together with her grandson age 104 and granddaughter age 41. There are 2 additional grandchildren and 1 step grandchild. The patient is a Psychologist, forensic.    ADVANCED DIRECTIVES: Not in place; the patient intends to name her daughter [name] as her healthcare power of attorney.   HEALTH MAINTENANCE: Social History  Substance Use Topics  . Smoking status: Never Smoker   . Smokeless tobacco: Not on file  . Alcohol Use: No     Colonoscopy:  PAP:  Bone density: 09/18/2009 at [place], shows osteoporosis (lowest T  score at the right hip -2.9  Lipid panel:  No Known Allergies  Current Outpatient Prescriptions  Medication Sig Dispense Refill  . Calcium 1200-1000 MG-UNIT CHEW Chew 1 capsule by mouth 2 (two) times daily.     . Cholecalciferol (VITAMIN D-3) 1000 UNITS CAPS Take 1 capsule by mouth 2 (two) times daily.     . Omega-3 Fatty Acids (FISH OIL) 500 MG CAPS Take 1 capsule by mouth 2 (two) times daily.     . tamoxifen (NOLVADEX) 20 MG tablet Take 1 tablet (20 mg total) by mouth daily. 90 tablet 4  . vitamin C (ASCORBIC ACID) 500 MG tablet Take 500 mg by mouth daily.     No current facility-administered medications for this visit.    OBJECTIVE: Middle-aged white woman in no acute distress Filed Vitals:   12/15/14 0924  BP: 162/82  Pulse: 82  Temp: 98.7 F (37.1 C)  Resp: 18     Body mass index is 21.65 kg/(m^2).    ECOG FS:0 - Asymptomatic  Sclerae unicteric, pupils round and equal Oropharynx clear and moist-- no thrush or other lesions No cervical or supraclavicular adenopathy Lungs no rales or rhonchi Heart regular rate and rhythm Abd soft, nontender, positive bowel sounds MSK some scoliosis but no focal spinal tenderness, no upper extremity lymphedema Neuro: nonfocal, well oriented, appropriate affect Breasts: The right breast is status post mastectomy. There is no evidence  of local recurrence. The right axilla is benign. Left breast is unremarkable  LAB RESULTS:  CMP     Component Value Date/Time   NA 139 06/16/2014 1335    I No results found for: SPEP  Lab Results  Component Value Date   WBC 7.6 12/15/2014      Chemistry      Component Value Date/Time   NA 139 06/16/2014 1335      Component Value Date/Time   CALCIUM 9.2 06/16/2014 1335       No results found for: LABCA2  No components found for: ZOXWR604  No results for input(s): INR in the last 168 hours.  Urinalysis No results found for: COLORURINE  STUDIES: Most recent mammogram at [facility] on  06/09/14 was unremarkable.  ASSESSMENT: 64 y.o. [place] woman status post right mastectomy and sentinel lymph node sampling 06/13/2013 for a pT2 pN1, stage IIB invasive mammary carcinoma (either an invasive ductal carcinoma with lobular features, or a lobular carcinoma that was E-cadherin strongly positive), estrogen and progesterone receptor positive, HER-2 nonamplified, with a low MIB-1  (1) there was no indication for postmastectomy radiation  (2) the patient opted against adjuvant chemotherapy  (3) tamoxifen started September 2015.  (4) osteoporosis by bone density scan 09/18/2009   PLAN: Breanna Nelson is tolerating the tamoxifen well and the plan will be to continue that at least 09/05/2015, which is when she will see me next. At that point we will discuss whether she was to switch to an aromatase inhibitor or continue on tamoxifen.  The advantage of switching of course is that she would get an additional 2 or 3 percentage points in risk reduction. However she can obtain the same risk reduction by simply continuing tamoxifen for a total of 10 years. She might prefer that because of her history of osteoporosis. Tamoxifen of course strengthens the bones whereas the aromatase inhibitors can weaken them.  I again inquired as to her children's screening for polyposis. Her daughter has been screened but her son so far refused this. I explained that if there was anything we could do to help him change his mind I would be glad to follow-up  I will start seeing her on a yearly basis beginning September of this coming year. She knows to call for any problems that may develop before her next visit.  Chauncey Cruel, MD   12/15/2014 9:38 AM

## 2015-04-27 ENCOUNTER — Other Ambulatory Visit (HOSPITAL_COMMUNITY)
Admission: RE | Admit: 2015-04-27 | Discharge: 2015-04-27 | Disposition: A | Discharge status: Home / Self Care | Payer: Commercial Managed Care - HMO | Source: Ambulatory Visit | Attending: Internal Medicine | Admitting: Internal Medicine

## 2015-04-27 DIAGNOSIS — Z124 Encounter for screening for malignant neoplasm of cervix: Secondary | ICD-10-CM | POA: Insufficient documentation

## 2015-09-11 ENCOUNTER — Other Ambulatory Visit: Payer: Self-pay

## 2015-09-11 DIAGNOSIS — C50411 Malignant neoplasm of upper-outer quadrant of right female breast: Secondary | ICD-10-CM

## 2015-09-13 NOTE — Progress Notes (Addendum)
Breanna Nelson DOB: 1950/03/08  MR#: [id number]  IFB#:[id number]  Patient Care Team: Tommy Medal, MD as PCP - General (Internal Medicine) Chauncey Cruel, MD as Consulting Physician (Oncology) [name], MD as Consulting Physician (General Surgery) [name], MD as Consulting Physician (Radiation Oncology) Dr [name], surgery  CHIEF COMPLAINT: Estrogen receptor positive breast cancer  CURRENT TREATMENT:  tamoxifen   BREAST CANCER HISTORY: From the original intake note:  The patient herself noted a lump in her right breast late April 20 15th. She brought it to her primary care physician's attention and on 05/06/2013 had a bilateral mammography with tomography at [facility]. The breast density was category C. There was a new area of architectural distortion at the 11:00 position of the right breast with no other significant findings. Accordingly a right breast ultrasound was performed the same day. This identified a 1 cm irregular mass in the right breast at the 10:00 position. This correlated well with the palpable findings and biopsy of the mass was obtained the same day. This showed (SAA [id number]) and invasive mammary carcinoma, with strong diffuse expression of E-cadherin both an invasive and in situ carcinomas, supporting a ductal etiology. Estrogen receptor was 100% positive, progesterone receptor was 96% positive, with strong staining intensity, the MIB-1 was 3%, and there was no HER-2 amplification, the signals ratio being 1.3 and the number per cell 2.5.  The patient underwent bilateral breast MRI 05/13/2013. This confirmed, in the upper outer quadrant of the right breast, several small nodules, associated with an area of postbiopsy hematoma. Taking together the area of enhancement and hematoma the largest measuring was 1.8 cm. There were no masses or abnormal enhancement seen in the left breast  and no suspicious lymph nodes.  The patient was seen in the multidisciplinary clinic by surgery and radiation oncology 05/15/2013. After discussion here and also with Dr. [name], and with a good understanding of the equivalent results between mastectomy and breast conservation surgery plus radiation, the patient opted for mastectomy (without planned reconstruction) and sentinel lymph node sampling. This was performed 06/13/2013 at [facility]. There was some postoperative bleeding requiring evacuation of a hematoma. The final pathology ([id number]) showed a 1.5 cm invasive mammary carcinoma (either at lobular breast cancer with the unusual feature of being E-cadherin positive, or a ductal breast cancer with lobular features), grade 2, with a positive intramammary lymph node. The sentinel lymph node and 4 additional axillary lymph nodes were negative. The tumor was again strongly estrogen and progesterone receptor positive, again HER-2 negative by FISH, with a low MIB-1-1, not read as 10%. Margins were negative.  On 07/03/2013 the patient was reevaluated by radiation oncology at they agreed that postmastectomy radiation was not indicated. They suggested consultation to medical oncology to consider adjuvant hormonal therapy.  The patient's subsequent history is as detailed below.  INTERVAL HISTORY: "Breanna Nelson" returns today for follow-up of her breast cancer. She is on tamoxifen, with very good tolerance. Hot flashes are not a problem. She has a slight vaginal discharge which she tells me does not require any intervention. She is obtaining the drug currently at  [place]: Breanna Nelson is going to the senior center about 3 days a week to exercise. This has caused a little bit of discomfort in her right axilla and a little bit in her right breast as well. Aside from that a detailed review of systems today was entirely  benign.  PAST MEDICAL HISTORY: Past Medical History:  Diagnosis Date  . Breast  cancer of upper-outer quadrant of right female breast   . Osteoporosis     PAST SURGICAL HISTORY: Past Surgical History:  Procedure Laterality Date  . SIMPLE MASTECTOMY W/ SENTINEL NODE BIOPSY Right 06/13/13   by Dr. [name]    FAMILY HISTORY Family History  Problem Relation Age of Onset  . Testicular cancer Brother   . Skin cancer Mother    the patient's father died at the age of 31. The patient's mother died at the age of 92 from heart disease. She also had Alzheimer's disease. The patient had one brother who died in the age of 43 with testicular cancer. One sister died as a baby. There is no history of breast or ovarian cancer in the family.  GYNECOLOGIC HISTORY:  No LMP recorded. Patient is postmenopausal. Menarche age 56, first live birth age 74. The patient is GX P2. She does not recall when she went through menopause, but he was more than 10 years ago. She never took hormone replacement. She did take birth control pills for a brief period, with no complications  SOCIAL HISTORY:  About works at [employer]. Her husband died from complications of lymphoma earlier this year. He also had undergone a colectomy in 2004. "He had more than 200 polyps". The patient's daughter [name] lives in [place] where she works as a Engineer, structural. She also does [employer]. Her son Shanda Cadotte is a truck driver. For financial reasons he is currently living in the patient's house together with her grandson age 15 and granddaughter age 94. There are 2 additional grandchildren and 1 step grandchild. The patient is a Psychologist, forensic.    ADVANCED DIRECTIVES: Not in place; the patient intends to name her daughter [name] as her healthcare power of attorney.   HEALTH MAINTENANCE: Social History  Substance Use Topics  . Smoking status: Never Smoker  . Smokeless tobacco: Not on file  . Alcohol use No     Colonoscopy:  PAP:  Bone density: 09/18/2009 at [facility],  shows osteoporosis (lowest T score at the right hip -2.9  Lipid panel:  No Known Allergies  Current Outpatient Prescriptions  Medication Sig Dispense Refill  . Calcium 1200-1000 MG-UNIT CHEW Chew 1 capsule by mouth 2 (two) times daily.     . Cholecalciferol (VITAMIN D-3) 1000 UNITS CAPS Take 1 capsule by mouth 2 (two) times daily.     . Omega-3 Fatty Acids (FISH OIL) 500 MG CAPS Take 1 capsule by mouth 2 (two) times daily.     . tamoxifen (NOLVADEX) 20 MG tablet Take 1 tablet (20 mg total) by mouth daily. 90 tablet 4  . vitamin C (ASCORBIC ACID) 500 MG tablet Take 500 mg by mouth daily.     No current facility-administered medications for this visit.     OBJECTIVE: Middle-aged white womanWho appears stated age 89:   09/14/15 1147  BP: (!) 161/88  Pulse: 74  Resp: 18  Temp: 98.1 F (36.7 C)     Body mass index is 21.71 kg/m.    ECOG FS:1 - Symptomatic but completely ambulatory  Sclerae unicteric, EOMs intact Oropharynx clear and moist No cervical or supraclavicular adenopathy Lungs no rales or rhonchi Heart regular rate and rhythm Abd soft, nontender, positive bowel sounds MSK kyphosis but no focal spinal tenderness, no upper extremity lymphedema Neuro: nonfocal, well oriented, appropriate affect Breasts: The right breast is status  post mastectomy. There is no evidence of chest wall recurrence. The right axilla is benign. The left breast is unremarkable.   LAB RESULTS:  CMP     Component Value Date/Time   NA 138 09/14/2015 1047    I No results found for: SPEP  Lab Results  Component Value Date   WBC 6.2 09/14/2015      Chemistry      Component Value Date/Time   NA 138 09/14/2015 1047      Component Value Date/Time   CALCIUM 9.3 09/14/2015 1047       No results found for: LABCA2  No components found for: QNVVY721  No results for input(s): INR in the last 168 hours.  Urinalysis No results found for: COLORURINE  STUDIES: Mammography at [facility]  06/11/2015 showed a breast density category C. There was no evidence of malignancy.  ASSESSMENT: 65 y.o. [place] woman status post right mastectomy and sentinel lymph node sampling 06/13/2013 for an upper outer quadrant pT2 pN1, stage IIB invasive mammary carcinoma (either an invasive ductal carcinoma with lobular features, or a lobular carcinoma that was E-cadherin strongly positive), estrogen and progesterone receptor positive, HER-2 nonamplified, with a low MIB-1  (1) there was no indication for postmastectomy radiation  (2) the patient opted against adjuvant chemotherapy  (3) tamoxifen started September 2015.  (4) osteoporosis by bone density scan 09/18/2009   PLAN: Breanna Nelson is now 2 years out from definitive surgery for her breast cancer with no evidence of disease recurrence. This is very favorable.  She is tolerating tamoxifen well. While we could switch to an aromatase inhibitor, this is likely to make her osteoporosis worse. Accordingly the plan is going to be to continue tamoxifen most likely to a total of 10 years.  At this point I am comfortable seeing her on a once a year basis.  She knows to call for any problems that may develop before her return visit  Chauncey Cruel, MD   09/14/2015 11:58 AM

## 2015-09-14 ENCOUNTER — Telehealth: Payer: Self-pay | Admitting: Oncology

## 2015-09-14 ENCOUNTER — Ambulatory Visit (HOSPITAL_BASED_OUTPATIENT_CLINIC_OR_DEPARTMENT_OTHER): Payer: Commercial Managed Care - HMO | Admitting: Oncology

## 2015-09-14 ENCOUNTER — Other Ambulatory Visit (HOSPITAL_BASED_OUTPATIENT_CLINIC_OR_DEPARTMENT_OTHER): Payer: Commercial Managed Care - HMO

## 2015-09-14 VITALS — BP 161/88 | HR 74 | Temp 98.1°F | Resp 18 | Ht 66.0 in | Wt 134.5 lb

## 2015-09-14 DIAGNOSIS — C50411 Malignant neoplasm of upper-outer quadrant of right female breast: Secondary | ICD-10-CM | POA: Diagnosis not present

## 2015-09-14 DIAGNOSIS — Z79810 Long term (current) use of selective estrogen receptor modulators (SERMs): Secondary | ICD-10-CM

## 2015-09-14 DIAGNOSIS — M810 Age-related osteoporosis without current pathological fracture: Secondary | ICD-10-CM | POA: Diagnosis not present

## 2015-09-14 DIAGNOSIS — Z170 Estrogen receptor positive status [ER+]: Secondary | ICD-10-CM | POA: Diagnosis not present

## 2015-09-14 LAB — COMPREHENSIVE METABOLIC PANEL
ALT: 15 U/L (ref 0–55)
AST: 23 U/L (ref 5–34)
Albumin: 3.7 g/dL (ref 3.5–5.0)
Alkaline Phosphatase: 50 U/L (ref 40–150)
Anion Gap: 11 mEq/L (ref 3–11)
BUN: 11.5 mg/dL (ref 7.0–26.0)
CO2: 26 mEq/L (ref 22–29)
Calcium: 9.3 mg/dL (ref 8.4–10.4)
Chloride: 102 mEq/L (ref 98–109)
Creatinine: 0.8 mg/dL (ref 0.6–1.1)
EGFR: 75 mL/min/{1.73_m2} — ABNORMAL LOW (ref >90)
Glucose: 98 mg/dl (ref 70–140)
Potassium: 4.2 mEq/L (ref 3.5–5.1)
Sodium: 138 mEq/L (ref 136–145)
Total Bilirubin: 0.42 mg/dL (ref 0.20–1.20)
Total Protein: 6.9 g/dL (ref 6.4–8.3)

## 2015-09-14 LAB — CBC WITH DIFFERENTIAL/PLATELET
BASO%: 0.5 % (ref 0.0–2.0)
Basophils Absolute: 0 10*3/uL (ref 0.0–0.1)
EOS%: 0.8 % (ref 0.0–7.0)
Eosinophils Absolute: 0.1 10*3/uL (ref 0.0–0.5)
HCT: 39.4 % (ref 34.8–46.6)
HGB: 13.2 g/dL (ref 11.6–15.9)
LYMPH%: 24.1 % (ref 14.0–49.7)
MCH: 30.1 pg (ref 25.1–34.0)
MCHC: 33.5 g/dL (ref 31.5–36.0)
MCV: 90 fL (ref 79.5–101.0)
MONO#: 0.7 10*3/uL (ref 0.1–0.9)
MONO%: 11 % (ref 0.0–14.0)
NEUT#: 3.9 10*3/uL (ref 1.5–6.5)
NEUT%: 63.6 % (ref 38.4–76.8)
Platelets: 211 10*3/uL (ref 145–400)
RBC: 4.38 10*6/uL (ref 3.70–5.45)
RDW: 13 % (ref 11.2–14.5)
WBC: 6.2 10*3/uL (ref 3.9–10.3)
lymph#: 1.5 10*3/uL (ref 0.9–3.3)

## 2015-09-14 NOTE — Telephone Encounter (Signed)
appt made and avs printed °

## 2016-01-05 ENCOUNTER — Telehealth: Payer: Self-pay | Admitting: *Deleted

## 2016-01-05 NOTE — Telephone Encounter (Signed)
Pt called to state concern for my neck and head hurts " like when I turn my neck I feel like there is something in my head "  Breanna Nelson states above has been ongoing for several months " and at first I thought it was how I was sleeping but it's not getting any better "  Area in head is in the lower right side with tenderness to touch and " itching"- " I heard that itching can be a sign of cancer "  Per further discussion - pt states she has mild nausea occasionally- Denies any vomiting or visual changes. She has no changes in ambulation or thought processes. No tingling in right arm.  Breanna Nelson has not used any tylenol or other medication for symptom .  Pt stated concern for symptom being related to her breast cancer - " maybe my cancer has spread to my head ?"  This RN reviewed her symptoms stating overall complaints are not indicative for brain mets but concerns will be given to MD for review for recommendations.  Return call number given as [phone].

## 2016-01-06 ENCOUNTER — Other Ambulatory Visit: Payer: Self-pay | Admitting: *Deleted

## 2016-01-06 DIAGNOSIS — C50411 Malignant neoplasm of upper-outer quadrant of right female breast: Secondary | ICD-10-CM

## 2016-01-06 DIAGNOSIS — M542 Cervicalgia: Secondary | ICD-10-CM

## 2016-01-06 DIAGNOSIS — M810 Age-related osteoporosis without current pathological fracture: Secondary | ICD-10-CM

## 2016-01-06 DIAGNOSIS — Z780 Asymptomatic menopausal state: Secondary | ICD-10-CM

## 2016-01-06 DIAGNOSIS — G4489 Other headache syndrome: Secondary | ICD-10-CM

## 2016-01-06 NOTE — Telephone Encounter (Signed)
Per MD review of concerns- order for CTs with out contrast to evaluate for bone mets.  Order entered and pt informed.

## 2016-01-12 ENCOUNTER — Encounter: Payer: Self-pay | Admitting: Oncology

## 2016-01-12 ENCOUNTER — Ambulatory Visit (HOSPITAL_COMMUNITY)
Admission: RE | Admit: 2016-01-12 | Discharge: 2016-01-12 | Disposition: A | Discharge status: Home / Self Care | Payer: Medicare PPO | Source: Ambulatory Visit | Attending: Oncology | Admitting: Oncology

## 2016-01-12 DIAGNOSIS — M47812 Spondylosis without myelopathy or radiculopathy, cervical region: Secondary | ICD-10-CM | POA: Insufficient documentation

## 2016-01-12 DIAGNOSIS — Z853 Personal history of malignant neoplasm of breast: Secondary | ICD-10-CM | POA: Diagnosis not present

## 2016-01-12 DIAGNOSIS — M5021 Other cervical disc displacement,  high cervical region: Secondary | ICD-10-CM | POA: Diagnosis not present

## 2016-01-12 DIAGNOSIS — M4802 Spinal stenosis, cervical region: Secondary | ICD-10-CM | POA: Diagnosis not present

## 2016-01-12 DIAGNOSIS — Z780 Asymptomatic menopausal state: Secondary | ICD-10-CM

## 2016-01-12 DIAGNOSIS — M8580 Other specified disorders of bone density and structure, unspecified site: Secondary | ICD-10-CM | POA: Diagnosis not present

## 2016-01-12 DIAGNOSIS — C50411 Malignant neoplasm of upper-outer quadrant of right female breast: Secondary | ICD-10-CM

## 2016-01-12 DIAGNOSIS — M542 Cervicalgia: Secondary | ICD-10-CM | POA: Diagnosis not present

## 2016-01-12 DIAGNOSIS — G4489 Other headache syndrome: Secondary | ICD-10-CM

## 2016-01-12 DIAGNOSIS — M810 Age-related osteoporosis without current pathological fracture: Secondary | ICD-10-CM

## 2016-01-25 ENCOUNTER — Other Ambulatory Visit: Payer: Self-pay | Admitting: *Deleted

## 2016-01-25 DIAGNOSIS — C50411 Malignant neoplasm of upper-outer quadrant of right female breast: Secondary | ICD-10-CM

## 2016-01-25 DIAGNOSIS — Z170 Estrogen receptor positive status [ER+]: Secondary | ICD-10-CM

## 2016-01-25 MED ORDER — TAMOXIFEN CITRATE 20 MG PO TABS: 20.0000 mg | ORAL_TABLET | Freq: Every day | ORAL | 4 refills | Status: DC

## 2016-02-09 ENCOUNTER — Other Ambulatory Visit: Payer: Self-pay | Admitting: *Deleted

## 2016-05-04 ENCOUNTER — Telehealth: Payer: Self-pay | Admitting: Oncology

## 2016-05-04 NOTE — Telephone Encounter (Signed)
PER PT-ROI TO MAIL RECORDS TO [facility]

## 2016-07-05 DIAGNOSIS — Z1231 Encounter for screening mammogram for malignant neoplasm of breast: Secondary | ICD-10-CM | POA: Diagnosis not present

## 2016-07-05 DIAGNOSIS — Z853 Personal history of malignant neoplasm of breast: Secondary | ICD-10-CM | POA: Diagnosis not present

## 2016-07-12 ENCOUNTER — Encounter: Payer: Self-pay | Admitting: Podiatry

## 2016-07-12 ENCOUNTER — Ambulatory Visit (INDEPENDENT_AMBULATORY_CARE_PROVIDER_SITE_OTHER): Payer: Medicare PPO | Admitting: Podiatry

## 2016-07-12 DIAGNOSIS — M205X1 Other deformities of toe(s) (acquired), right foot: Secondary | ICD-10-CM | POA: Diagnosis not present

## 2016-07-12 DIAGNOSIS — Q828 Other specified congenital malformations of skin: Secondary | ICD-10-CM | POA: Diagnosis not present

## 2016-07-12 NOTE — Patient Instructions (Signed)
Today your examination demonstrated a functional limitation of the right big toe joint resulting in excessive pressure on the bottom of the big toe causing and reactive thickening of the skin. If this time I recommended only occasional trimming of the reactive thick skin urine office or self treatment with a callus file or pumice stone. Return as needed  Hallux Rigidus Hallux rigidus is a type of joint pain or joint disease (arthritis) that affects your big toe (hallux). This condition involves the joint that connects the base of your big toe to the main part of your foot (metatarsophalangeal joint). This condition can cause your big toe to become stiff, painful, and difficult to move. Symptoms may get worse with movement or in cold or damp weather. The condition also gets worse over time. What are the causes? This condition may be caused by having a foot that does not function the way that it should or has an abnormal shape (structural deformity). These foot problems can run in families (be hereditary). This condition can also be caused by:  Injury.  Overuse.  Certain inflammatory diseases, including gout and rheumatoid arthritis.  What increases the risk? This condition is more likely to develop in people who:  Have a foot bone (metatarsal) that is longer or higher than normal.  Have a family history of hallux rigidus.  Have previously injured their big toe.  Have feet that do not have a curve (arch) on the inner side of the foot. This may be called flat feet or fallen arches.  Turn their ankles in when they walk (pronation).  Have rheumatoid arthritis or gout.  Have to stoop down often at work.  What are the signs or symptoms? Symptoms of this condition include:  Big toe pain.  Stiffness and difficulty moving the big toe.  Swelling of the toe and surrounding area.  Bone spurs. These are bony growths that can form on the joint of the big toe.  A limp.  How is this  diagnosed? This condition is diagnosed based on a medical history and physical exam. This may include X-rays. How is this treated? Treatment for this condition includes:  Wearing roomy, comfortable shoes that have a large toe box.  Putting orthotic devices in your shoes.  Pain medicines.  Physical therapy.  Icing the injured area.  Alternate between putting your foot in cold water then warm water.  If your condition is severe, treatment may include:  Corticosteroid injections to relieve pain.  Surgery to remove bone spurs, fuse damaged bones together, or replace the entire joint.  Follow these instructions at home:  Take over-the-counter and prescription medicines only as told by your health care provider.  Do not wear high heels or other restrictive footwear. Wear comfortable, supportive shoes that have a large toe box.  Wear orthotics as told by your health care provider, if this applies.  Put your feet in cold water for 30 seconds, then in warm water for 30 seconds. Alternate between the cold and warm water for 5 minutes. Do this several times a day or as told by your health care provider.  If directed, apply ice to the injured area. ? Put ice in a plastic bag. ? Place a towel between your skin and the bag. ? Leave the ice on for 20 minutes, 2-3 times per day.  Do foot exercises as instructed by your health care provider or a physical therapist.  Keep all follow-up visits as told by your health care provider. This  is important. Contact a health care provider if:  You notice bone spurs or growths on or around your big toe.  Your pain does not get better or it gets worse.  You have pain while resting.  You have pain in other parts of your body, such as your back, hip, or knee.  You start to limp. This information is not intended to replace advice given to you by your health care provider. Make sure you discuss any questions you have with your health care  provider. Document Released: 12/20/2004 Document Revised: 05/28/2015 Document Reviewed: 08/27/2014 Elsevier Interactive Patient Education  [employer].

## 2016-07-12 NOTE — Progress Notes (Signed)
   Subjective:    Patient ID: Breanna Nelson, female    DOB: [birth year], 66 y.o.   MRN: [id number]  HPI This patient presents today complaining of a plantar skin lesion on the right hallux present for approximately 2 months. Patient states that the areas of uncomfortable when she walks without shoes, however, has minimal discomfort when wearing shoes. Patient has attempted to reduce the thickness of the skin lesion by rubbing with a washcloth after showering. Patient denies smoking history Patient denies history of pain, burning or numbness and feet  Review of Systems  All other systems reviewed and are negative.      Objective:   Physical Exam  Patient is approximately 5 foot 6 inches and weighs approximately 130 pounds  Vascular: DP and PT pulses 2/4 bilaterally Capillary reflex within normal limits bilaterally  Neurological: Sensation to 10 g monofilament wire intact 5/5 bilaterally Vibratory sensation reactive bilaterally Ankle reflex equal reactive bilaterally  Dermatological: Open skin lesions bilaterally Nucleated plantar skin lesion sub-right hallux interphalangeal joint  Musculoskeletal Functional hallux limitus bilaterally Ski shape right hallux      Assessment & Plan:   Assessment: Satisfactory neurovascular status Functional hallux limitus bilaterally Porokeratosis plantar right hallux  Plan: Today I reviewed the results of the exam with patient today and recommended periodic debridement of the callus. As patient is having minimal symptoms I recommended she wear an athletic style shoe and okay to self treat this plantar callus with a pumice stone or callus file if it becomes progressively more comfortable and self treatment is not beneficial return to the office for further evaluation and treatment

## 2016-09-09 ENCOUNTER — Other Ambulatory Visit: Payer: Self-pay

## 2016-09-09 DIAGNOSIS — C50411 Malignant neoplasm of upper-outer quadrant of right female breast: Secondary | ICD-10-CM

## 2016-09-11 NOTE — Progress Notes (Signed)
[facility]  [phone] [phone]) [phone]     ID: [name] DOB: Dec 03, 1950  MR#: [id number]  CSN#:[id number]  Patient Care Team: Tommy Medal, MD as PCP - General (Internal Medicine) Magrinat, [name], MD as Consulting Physician (Oncology) [name], MD as Consulting Physician (General Surgery) [name], MD as Consulting Physician (Radiation Oncology) Dr [name], surgery  CHIEF COMPLAINT: Estrogen receptor positive breast cancer  CURRENT TREATMENT:  tamoxifen   BREAST CANCER HISTORY: From the original intake note:  The patient herself noted a lump in her right breast late April 20 15th. She brought it to her primary care physician's attention and on 05/06/2013 had a bilateral mammography with tomography at [facility]. The breast density was category C. There was a new area of architectural distortion at the 11:00 position of the right breast with no other significant findings. Accordingly a right breast ultrasound was performed the same day. This identified a 1 cm irregular mass in the right breast at the 10:00 position. This correlated well with the palpable findings and biopsy of the mass was obtained the same day. This showed (SAA [phone]) and invasive mammary carcinoma, with strong diffuse expression of E-cadherin both an invasive and in situ carcinomas, supporting a ductal etiology. Estrogen receptor was 100% positive, progesterone receptor was 96% positive, with strong staining intensity, the MIB-1 was 3%, and there was no HER-2 amplification, the signals ratio being 1.3 and the number per cell 2.5.  The patient underwent bilateral breast MRI 05/13/2013. This confirmed, in the upper outer quadrant of the right breast, several small nodules, associated with an area of postbiopsy hematoma. Taking together the area of enhancement and hematoma the largest measuring was 1.8 cm. There were no masses or abnormal enhancement seen in the left  breast and no suspicious lymph nodes.  The patient was seen in the multidisciplinary clinic by surgery and radiation oncology 05/15/2013. After discussion here and also with Dr. [name], and with a good understanding of the equivalent results between mastectomy and breast conservation surgery plus radiation, the patient opted for mastectomy (without planned reconstruction) and sentinel lymph node sampling. This was performed 06/13/2013 at [facility]. There was some postoperative bleeding requiring evacuation of a hematoma. The final pathology ([id number]) showed a 1.5 cm invasive mammary carcinoma (either at lobular breast cancer with the unusual feature of being E-cadherin positive, or a ductal breast cancer with lobular features), grade 2, with a positive intramammary lymph node. The sentinel lymph node and 4 additional axillary lymph nodes were negative. The tumor was again strongly estrogen and progesterone receptor positive, again HER-2 negative by FISH, with a low MIB-1-1, not read as 10%. Margins were negative.  On 07/03/2013 the patient was reevaluated by radiation oncology at they agreed that postmastectomy radiation was not indicated. They suggested consultation to medical oncology to consider adjuvant hormonal therapy.  The patient's subsequent history is as detailed below.  INTERVAL HISTORY: "Breanna Nelson" returns today for follow-up and treatment of her estrogen receptor positive breast cancer. She continues on tamoxifen, with excellent tolerance. Hot flashes and vaginal wetness are not major issues. She obtains a drug at a good price.   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 She donates blood very frequently and has been found to be borderline iron deficient. She has hit her right lower leg on many occasions (with the pocket she uses to  clean, for example) and now has a bruise  there. She wonders if she is developing bone cancer there. She also has pain which is very valuable and intermittent in the right axilla area, which is of course related to her earlier surgery. Aside from these issues a detailed review of systems today was stable  PAST MEDICAL HISTORY: Past Medical History:  Diagnosis Date  . Breast cancer of upper-outer quadrant of right female breast ([place])   . Osteoporosis     PAST SURGICAL HISTORY: Past Surgical History:  Procedure Laterality Date  . SIMPLE MASTECTOMY W/ SENTINEL NODE BIOPSY Right 06/13/13   by Dr. [name]    FAMILY HISTORY Family History  Problem Relation Age of Onset  . Testicular cancer Brother   . Skin cancer Mother    the patient's father died at the age of 82. The patient's mother died at the age of 68 from heart disease. She also had Alzheimer's disease. The patient had one brother who died in the age of 68 with testicular cancer. One sister died as a baby. There is no history of breast or ovarian cancer in the family.  GYNECOLOGIC HISTORY:  No LMP recorded. Patient is postmenopausal. Menarche age 66, first live birth age 66. The patient is GX P2. She does not recall when she went through menopause, but he was more than 10 years ago. She never took hormone replacement. She did take birth control pills for a brief period, with no complications  SOCIAL HISTORY:  About works at [employer]. At home it's just she and her dog [name]. Her husband died from complications of lymphoma earlier this year. He also had undergone a colectomy in 2004. "He had more than 200 polyps". The patient's daughter [name] lives in [place] where she works as a Engineer, structural. She also does [employer]. Her son Shavawn Stobaugh is a truck driver. The patient has 4 grandchildren. The patient is a Psychologist, forensic.    ADVANCED DIRECTIVES: Not in place; the patient intends to name her daughter [name] as her healthcare power of attorney.   HEALTH  MAINTENANCE: Social History  Substance Use Topics  . Smoking status: Never Smoker  . Smokeless tobacco: Never Used  . Alcohol use No     Colonoscopy:  PAP:  Bone density:   Lipid panel:  No Known Allergies  Current Outpatient Prescriptions  Medication Sig Dispense Refill  . Calcium 1200-1000 MG-UNIT CHEW Chew 1 capsule by mouth 2 (two) times daily.     . Cholecalciferol (VITAMIN D-3) 1000 UNITS CAPS Take 1 capsule by mouth 2 (two) times daily.     Marland Kitchen FLAXSEED, LINSEED, PO Take by mouth.    . Omega-3 Fatty Acids (FISH OIL) 500 MG CAPS Take 1 capsule by mouth 2 (two) times daily.     . tamoxifen (NOLVADEX) 20 MG tablet Take 1 tablet (20 mg total) by mouth daily. 90 tablet 4  . Turmeric Curcumin 500 MG CAPS Take 2 capsules by mouth daily.    . vitamin C (ASCORBIC ACID) 500 MG tablet Take 500 mg by mouth daily.     No current facility-administered medications for this visit.     OBJECTIVE: Middle-aged white woman in no acute distress  Vitals:   09/12/16 1251  BP: (!) 173/88  Pulse: 84  Resp: 17  Temp: 98.4 F (36.9 C)  SpO2: 100%     Body mass index is 20.53 kg/m.    ECOG FS:1 - Symptomatic but completely ambulatory  Sclerae unicteric, pupils round and equal Oropharynx clear and moist No cervical or supraclavicular adenopathy Lungs no rales or rhonchi Heart regular rate and rhythm Abd soft, nontender, positive bowel sounds MSK no focal spinal tenderness, no upper extremity lymphedema; the right lower leg is unremarkable except for a mild ecchymosis Neuro: nonfocal, well oriented, appropriate affect Breasts: She has undergone right mastectomy. There is no evidence of local recurrence. The right axilla is benign. The left breast and left axilla are benign.  LAB RESULTS:  CMP     Component Value Date/Time   NA 138 09/14/2015 1047    I No results found for: SPEP  Lab Results  Component Value Date   WBC 6.7 09/12/2016      Chemistry      Component Value  Date/Time   NA 138 09/14/2015 1047      Component Value Date/Time   CALCIUM 9.3 09/14/2015 1047       No results found for: LABCA2  No components found for: XYBFX832  No results for input(s): INR in the last 168 hours.  Urinalysis No results found for: COLORURINE  STUDIES: Unilateral left mammography at [facility] 07/05/2016 showed the rest density to be category C. There was no evidence of malignancy.   ASSESSMENT: 66 y.o. [place] woman status post right mastectomy and sentinel lymph node sampling 06/13/2013 for an upper outer quadrant pT2 pN1, stage IIB invasive mammary carcinoma (either an invasive ductal carcinoma with lobular features, or a lobular carcinoma that was E-cadherin strongly positive), estrogen and progesterone receptor positive, HER-2 nonamplified, with a low MIB-1  (1) there was no indication for postmastectomy radiation  (2) the patient opted against adjuvant chemotherapy  (3) tamoxifen started September 2015.  (4) osteoporosis by bone density scan 09/18/2009   PLAN: Breanna Nelson is now a little over 3 years out from definitive surgery for her breast cancer with no evidence of disease recurrence. This is very favorable.  She is tolerating tamoxifen well and the plan will be to continue that for a total of 5 years, which will take [place] to September 2020.  I reassured her that the pain she is experiencing in her right axilla is, and it is not related to breast cancer.  I also reassured her regarding the right lower extremity discomfort she has but since she continues to be concerned we are going to obtain plain films of her tibia today. She will have those results available through "my chart".  Given her husband's history of 100 polyps, I reminded her that it will be important for both her children to have colonoscopies.  Finally she understands she is mildly iron deficient due to her continuing to donate blood. This is a wonderful thing she is doing. Possibly she  could refrain for 3-6 months at her body recover a little bit and then go back to donating blood. I did reassure her that she has no anemia at present.  Otherwise she will return to see me in one year. She knows to call for any problems that may develop before that visit.  Chauncey Cruel, MD   09/12/2016 1:05 PM

## 2016-09-12 ENCOUNTER — Ambulatory Visit (HOSPITAL_COMMUNITY)
Admission: RE | Admit: 2016-09-12 | Discharge: 2016-09-12 | Disposition: A | Discharge status: Home / Self Care | Payer: Medicare PPO | Source: Ambulatory Visit | Attending: Oncology | Admitting: Oncology

## 2016-09-12 ENCOUNTER — Other Ambulatory Visit (HOSPITAL_BASED_OUTPATIENT_CLINIC_OR_DEPARTMENT_OTHER): Payer: Commercial Managed Care - HMO

## 2016-09-12 ENCOUNTER — Ambulatory Visit (HOSPITAL_BASED_OUTPATIENT_CLINIC_OR_DEPARTMENT_OTHER): Payer: Commercial Managed Care - HMO | Admitting: Oncology

## 2016-09-12 VITALS — BP 173/88 | HR 84 | Temp 98.4°F | Resp 17 | Ht 66.0 in | Wt 127.2 lb

## 2016-09-12 DIAGNOSIS — E611 Iron deficiency: Secondary | ICD-10-CM | POA: Diagnosis not present

## 2016-09-12 DIAGNOSIS — Z170 Estrogen receptor positive status [ER+]: Secondary | ICD-10-CM | POA: Diagnosis not present

## 2016-09-12 DIAGNOSIS — C50411 Malignant neoplasm of upper-outer quadrant of right female breast: Secondary | ICD-10-CM

## 2016-09-12 DIAGNOSIS — M79661 Pain in right lower leg: Secondary | ICD-10-CM | POA: Diagnosis not present

## 2016-09-12 DIAGNOSIS — Z79810 Long term (current) use of selective estrogen receptor modulators (SERMs): Secondary | ICD-10-CM

## 2016-09-12 DIAGNOSIS — M810 Age-related osteoporosis without current pathological fracture: Secondary | ICD-10-CM | POA: Diagnosis not present

## 2016-09-12 LAB — COMPREHENSIVE METABOLIC PANEL
ALT: 15 U/L (ref 0–55)
AST: 25 U/L (ref 5–34)
Albumin: 3.6 g/dL (ref 3.5–5.0)
Alkaline Phosphatase: 53 U/L (ref 40–150)
Anion Gap: 8 mEq/L (ref 3–11)
BUN: 11.9 mg/dL (ref 7.0–26.0)
CO2: 28 mEq/L (ref 22–29)
Calcium: 8.9 mg/dL (ref 8.4–10.4)
Chloride: 103 mEq/L (ref 98–109)
Creatinine: 0.8 mg/dL (ref 0.6–1.1)
EGFR: 82 mL/min/{1.73_m2} — ABNORMAL LOW (ref >90)
Glucose: 118 mg/dl (ref 70–140)
Potassium: 3.5 mEq/L (ref 3.5–5.1)
Sodium: 139 mEq/L (ref 136–145)
Total Bilirubin: 0.32 mg/dL (ref 0.20–1.20)
Total Protein: 6.4 g/dL (ref 6.4–8.3)

## 2016-09-12 LAB — CBC WITH DIFFERENTIAL/PLATELET
BASO%: 0.5 % (ref 0.0–2.0)
Basophils Absolute: 0 10*3/uL (ref 0.0–0.1)
EOS%: 0.6 % (ref 0.0–7.0)
Eosinophils Absolute: 0 10*3/uL (ref 0.0–0.5)
HCT: 38.1 % (ref 34.8–46.6)
HGB: 12.4 g/dL (ref 11.6–15.9)
LYMPH%: 23.7 % (ref 14.0–49.7)
MCH: 28.2 pg (ref 25.1–34.0)
MCHC: 32.6 g/dL (ref 31.5–36.0)
MCV: 86.6 fL (ref 79.5–101.0)
MONO#: 0.6 10*3/uL (ref 0.1–0.9)
MONO%: 9.5 % (ref 0.0–14.0)
NEUT#: 4.4 10*3/uL (ref 1.5–6.5)
NEUT%: 65.7 % (ref 38.4–76.8)
Platelets: 216 10*3/uL (ref 145–400)
RBC: 4.4 10*6/uL (ref 3.70–5.45)
RDW: 19.8 % — ABNORMAL HIGH (ref 11.2–14.5)
WBC: 6.7 10*3/uL (ref 3.9–10.3)
lymph#: 1.6 10*3/uL (ref 0.9–3.3)

## 2017-02-17 DIAGNOSIS — R1084 Generalized abdominal pain: Secondary | ICD-10-CM | POA: Diagnosis not present

## 2017-02-17 DIAGNOSIS — R109 Unspecified abdominal pain: Secondary | ICD-10-CM | POA: Diagnosis not present

## 2017-02-23 DIAGNOSIS — R109 Unspecified abdominal pain: Secondary | ICD-10-CM | POA: Diagnosis not present

## 2017-03-06 ENCOUNTER — Other Ambulatory Visit: Payer: Self-pay | Admitting: Oncology

## 2017-03-06 DIAGNOSIS — C50411 Malignant neoplasm of upper-outer quadrant of right female breast: Secondary | ICD-10-CM

## 2017-03-06 DIAGNOSIS — Z170 Estrogen receptor positive status [ER+]: Secondary | ICD-10-CM

## 2017-03-31 DIAGNOSIS — D225 Melanocytic nevi of trunk: Secondary | ICD-10-CM | POA: Diagnosis not present

## 2017-03-31 DIAGNOSIS — L853 Xerosis cutis: Secondary | ICD-10-CM | POA: Diagnosis not present

## 2017-03-31 DIAGNOSIS — L820 Inflamed seborrheic keratosis: Secondary | ICD-10-CM | POA: Diagnosis not present

## 2017-03-31 DIAGNOSIS — D1801 Hemangioma of skin and subcutaneous tissue: Secondary | ICD-10-CM | POA: Diagnosis not present

## 2017-04-28 DIAGNOSIS — I10 Essential (primary) hypertension: Secondary | ICD-10-CM | POA: Diagnosis not present

## 2017-04-28 DIAGNOSIS — E559 Vitamin D deficiency, unspecified: Secondary | ICD-10-CM | POA: Diagnosis not present

## 2017-04-28 DIAGNOSIS — Z0000 Encounter for general adult medical examination without abnormal findings: Secondary | ICD-10-CM | POA: Diagnosis not present

## 2017-04-28 DIAGNOSIS — N390 Urinary tract infection, site not specified: Secondary | ICD-10-CM | POA: Diagnosis not present

## 2017-05-05 DIAGNOSIS — E875 Hyperkalemia: Secondary | ICD-10-CM | POA: Diagnosis not present

## 2017-05-05 DIAGNOSIS — Z52008 Unspecified donor, other blood: Secondary | ICD-10-CM | POA: Diagnosis not present

## 2017-05-05 DIAGNOSIS — N281 Cyst of kidney, acquired: Secondary | ICD-10-CM | POA: Diagnosis not present

## 2017-05-05 DIAGNOSIS — I10 Essential (primary) hypertension: Secondary | ICD-10-CM | POA: Diagnosis not present

## 2017-05-05 DIAGNOSIS — Q447 Other congenital malformations of liver: Secondary | ICD-10-CM | POA: Diagnosis not present

## 2017-05-05 DIAGNOSIS — Z0000 Encounter for general adult medical examination without abnormal findings: Secondary | ICD-10-CM | POA: Diagnosis not present

## 2017-05-05 DIAGNOSIS — M810 Age-related osteoporosis without current pathological fracture: Secondary | ICD-10-CM | POA: Diagnosis not present

## 2017-05-05 DIAGNOSIS — R1084 Generalized abdominal pain: Secondary | ICD-10-CM | POA: Diagnosis not present

## 2017-05-05 DIAGNOSIS — C50411 Malignant neoplasm of upper-outer quadrant of right female breast: Secondary | ICD-10-CM | POA: Diagnosis not present

## 2017-05-05 DIAGNOSIS — R7303 Prediabetes: Secondary | ICD-10-CM | POA: Diagnosis not present

## 2017-07-14 DIAGNOSIS — Z853 Personal history of malignant neoplasm of breast: Secondary | ICD-10-CM | POA: Diagnosis not present

## 2017-07-14 DIAGNOSIS — Z1231 Encounter for screening mammogram for malignant neoplasm of breast: Secondary | ICD-10-CM | POA: Diagnosis not present

## 2017-08-15 ENCOUNTER — Telehealth: Payer: Self-pay | Admitting: Medical

## 2017-08-15 ENCOUNTER — Inpatient Hospital Stay: Payer: Medicare PPO

## 2017-08-15 ENCOUNTER — Telehealth: Payer: Self-pay | Admitting: *Deleted

## 2017-08-15 ENCOUNTER — Inpatient Hospital Stay: Payer: Medicare PPO | Attending: Medical | Admitting: Medical

## 2017-08-15 VITALS — BP 181/108 | HR 84 | Temp 98.9°F | Resp 18 | Ht 66.0 in | Wt 123.2 lb

## 2017-08-15 DIAGNOSIS — M810 Age-related osteoporosis without current pathological fracture: Secondary | ICD-10-CM

## 2017-08-15 DIAGNOSIS — Z170 Estrogen receptor positive status [ER+]: Secondary | ICD-10-CM

## 2017-08-15 DIAGNOSIS — Z79810 Long term (current) use of selective estrogen receptor modulators (SERMs): Secondary | ICD-10-CM | POA: Diagnosis not present

## 2017-08-15 DIAGNOSIS — C50411 Malignant neoplasm of upper-outer quadrant of right female breast: Secondary | ICD-10-CM | POA: Diagnosis not present

## 2017-08-15 DIAGNOSIS — R0789 Other chest pain: Secondary | ICD-10-CM

## 2017-08-15 LAB — CBC WITH DIFFERENTIAL (CANCER CENTER ONLY)
Basophils Absolute: 0 10*3/uL (ref 0.0–0.1)
Basophils Relative: 1 %
Eosinophils Absolute: 0.1 10*3/uL (ref 0.0–0.5)
Eosinophils Relative: 1 %
HCT: 42.8 % (ref 34.8–46.6)
Hemoglobin: 14.2 g/dL (ref 11.6–15.9)
Lymphocytes Relative: 27 %
Lymphs Abs: 1.7 10*3/uL (ref 0.9–3.3)
MCH: 31.5 pg (ref 25.1–34.0)
MCHC: 33.2 g/dL (ref 31.5–36.0)
MCV: 94.9 fL (ref 79.5–101.0)
Monocytes Absolute: 0.4 10*3/uL (ref 0.1–0.9)
Monocytes Relative: 7 %
Neutro Abs: 4 10*3/uL (ref 1.5–6.5)
Neutrophils Relative %: 64 %
Platelet Count: 206 10*3/uL (ref 145–400)
RBC: 4.51 MIL/uL (ref 3.70–5.45)
RDW: 13.2 % (ref 11.2–14.5)
WBC Count: 6.2 10*3/uL (ref 3.9–10.3)

## 2017-08-15 LAB — CMP (CANCER CENTER ONLY)
ALT: 22 U/L (ref 0–44)
AST: 32 U/L (ref 15–41)
Albumin: 3.9 g/dL (ref 3.5–5.0)
Alkaline Phosphatase: 46 U/L (ref 38–126)
Anion gap: 10 (ref 5–15)
BUN: 10 mg/dL (ref 8–23)
CO2: 29 mmol/L (ref 22–32)
Calcium: 9 mg/dL (ref 8.9–10.3)
Chloride: 102 mmol/L (ref 98–111)
Creatinine: 0.8 mg/dL (ref 0.44–1.00)
GFR, Est AFR Am: >60 mL/min (ref >60)
GFR, Estimated: >60 mL/min (ref >60)
Glucose, Bld: 119 mg/dL — ABNORMAL HIGH (ref 70–99)
Potassium: 4.7 mmol/L (ref 3.5–5.1)
Sodium: 141 mmol/L (ref 135–145)
Total Bilirubin: 0.6 mg/dL (ref 0.3–1.2)
Total Protein: 6.7 g/dL (ref 6.5–8.1)

## 2017-08-15 NOTE — Telephone Encounter (Signed)
Breanna Nelson left a voicemail stating she has an appt on 9/12 with Dr Jana Hakim. Is requesting an earlier appt as she has an area of concern under her arm. Is having discomfort. Please advise if you have a date and time available to see her. Scheduler advised that your calendar is very full up to 9/12.

## 2017-08-15 NOTE — Telephone Encounter (Signed)
Breanna Nelson will come today at 2:00pm to see [name], PA to evaluate area under arm per Dr Jana Hakim request.   Message to scheduler

## 2017-08-15 NOTE — Progress Notes (Signed)
Pt reports pain in underarm area with movement & gen soreness.  No injury reported.  No lumps or redness noted.

## 2017-08-15 NOTE — Telephone Encounter (Signed)
Could not add to 2pm

## 2017-08-15 NOTE — Telephone Encounter (Signed)
Add on lab appt per verbal from [employer] 8/13

## 2017-08-16 NOTE — Progress Notes (Signed)
[facility] Progress Note   Breanna Nelson [id number] 02-27-1950 67 y.o.  Breanna Nelson is managed by Dr. Jana Hakim  Actively treated with chemotherapy/immunotherapy: no  Assessment: Plan:    Malignant neoplasm of upper-outer quadrant of right breast in female, estrogen receptor positive ([place]) - Plan: CT Chest W Contrast, CT Chest Wo Contrast, CBC with Differential ([place] Only), CMP ([place] only), CT CHEST W WO CONTRAST  Other chest pain - Plan: CT Chest W Contrast, CT Chest Wo Contrast, CBC with Differential ([place] Only), CMP ([place] only), CT CHEST W WO CONTRAST   ER positive malignant neoplasm of the right breast with right lateral chest pain: Patient has been referred for a CT scan of the chest.  She is scheduled to see Dr. Jana Hakim in follow-up on 09/14/2017.  Please see After Visit Summary for patient specific instructions.  Future Appointments  Date Time Provider [place]  08/17/2017 12:00 PM [facility] 2 [facility] [place]  09/14/2017 12:30 PM [facility] LAB 6 [facility] None  09/14/2017  1:00 PM Magrinat, [name], MD [facility] None    Orders Placed This Encounter  Procedures  . CT Chest W Contrast  . CT Chest Wo Contrast  . CT CHEST W WO CONTRAST  . CBC with Differential ([place] Only)  . CMP ([place] only)       Subjective:   Patient ID:  Breanna Nelson is a 67 y.o. (DOB Feb 04, 1950) female.  Chief Complaint:  Chief Complaint  Patient presents with  . Arm Pain    HPI Breanna Nelson is a 67 year old female with a history of an ER positive malignant neoplasm of the right breast who is followed by Dr. Jana Hakim and is on tamoxifen.  The patient presents to the office today with a report of pressure and tightness under her right axilla with an aching sensation.  She denies any masses, nodules, erythema, or swelling.  She also denies any recent changes in activity or injuries.  Medications: I have  reviewed the patient's current medications.  Allergies: No Known Allergies  Past Medical History:  Diagnosis Date  . Breast cancer of upper-outer quadrant of right female breast ([place])   . Osteoporosis     Past Surgical History:  Procedure Laterality Date  . SIMPLE MASTECTOMY W/ SENTINEL NODE BIOPSY Right 06/13/13   by Dr. [name]    Family History  Problem Relation Age of Onset  . Testicular cancer Brother   . Skin cancer Mother     Social History   Socioeconomic History  . Marital status: Widowed    Spouse name: Not on file  . Number of children: 2  . Years of education: Not on file  . Highest education level: Not on file  Occupational History  . Not on file  Social Needs  . Financial resource strain: Not on file  . Food insecurity:    Worry: Not on file    Inability: Not on file  . Transportation needs:    Medical: Not on file    Non-medical: Not on file  Tobacco Use  . Smoking status: Never Smoker  . Smokeless tobacco: Never Used  Substance and Sexual Activity  . Alcohol use: No  . Drug use: No  . Sexual activity: Not on file  Lifestyle  . Physical activity:    Days per week: Not on file    Minutes per session: Not on file  . Stress: Not on file  Relationships  . Social  connections:    Talks on phone: Not on file    Gets together: Not on file    Attends religious service: Not on file    Active member of club or organization: Not on file    Attends meetings of clubs or organizations: Not on file    Relationship status: Not on file  . Intimate partner violence:    Fear of current or ex partner: Not on file    Emotionally abused: Not on file    Physically abused: Not on file    Forced sexual activity: Not on file  Other Topics Concern  . Not on file  Social History Narrative  . Not on file    Past Medical History, Surgical history, Social history, and Family history were reviewed and updated as appropriate.   Please see review of systems  for further details on the patient's review from today.   Review of Systems:  Review of Systems  Constitutional: Negative for chills, diaphoresis and fever.  HENT: Negative for trouble swallowing and voice change.   Respiratory: Negative for cough, chest tightness, shortness of breath and wheezing.   Cardiovascular: Positive for chest pain. Negative for palpitations.  Gastrointestinal: Negative for abdominal pain, constipation, diarrhea, nausea and vomiting.  Musculoskeletal: Negative for back pain and myalgias.  Neurological: Negative for dizziness, light-headedness and headaches.    Objective:   Physical Exam:  BP (!) 181/108 (BP Location: Left Arm, Patient Position: Sitting) Comment: Notified Nurse of BP  Pulse 84   Temp 98.9 F (37.2 C) (Oral)   Resp 18   Ht 5\' 6"  (1.676 m)   Wt 123 lb 3.2 oz (55.9 kg)   SpO2 95%   BMI 19.89 kg/m  ECOG: 0  Physical Exam  Constitutional: No distress.  HENT:  Head: Normocephalic and atraumatic.  Cardiovascular: Normal rate, regular rhythm and normal heart sounds. Exam reveals no gallop and no friction rub.  No murmur heard. Left breast is present with nipple everted with no discharge.  No palpable masses, nodules, or distortion of skin are noted.  No axillary tail lymphadenopathy.  The right breast is absent with a well-healed surgical scar.  Borderline right anterior axillary lymph nodes are appreciated.  No masses, tenderness, or nodules are noted along the right chest wall.  Pulmonary/Chest: Effort normal and breath sounds normal. No respiratory distress. She has no wheezes. She has no rales.  Neurological: She is alert.  Skin: Skin is warm and dry. No rash noted. She is not diaphoretic. No erythema.    Lab Review:     Component Value Date/Time   NA 141 08/15/2017 1421   NA 139 09/12/2016 1235   K 4.7 08/15/2017 1421   K 3.5 09/12/2016 1235   CL 102 08/15/2017 1421   CO2 29 08/15/2017 1421   CO2 28 09/12/2016 1235   GLUCOSE 119  (H) 08/15/2017 1421   GLUCOSE 118 09/12/2016 1235   BUN 10 08/15/2017 1421   BUN 11.9 09/12/2016 1235   CREATININE 0.80 08/15/2017 1421   CREATININE 0.8 09/12/2016 1235   CALCIUM 9.0 08/15/2017 1421   CALCIUM 8.9 09/12/2016 1235   PROT 6.7 08/15/2017 1421   PROT 6.4 09/12/2016 1235   ALBUMIN 3.9 08/15/2017 1421   ALBUMIN 3.6 09/12/2016 1235   AST 32 08/15/2017 1421   AST 25 09/12/2016 1235   ALT 22 08/15/2017 1421   ALT 15 09/12/2016 1235   ALKPHOS 46 08/15/2017 1421   ALKPHOS 53 09/12/2016 1235   BILITOT  0.6 08/15/2017 1421   BILITOT 0.32 09/12/2016 1235   GFRNONAA >60 08/15/2017 1421   GFRAA >60 08/15/2017 1421       Component Value Date/Time   WBC 6.2 08/15/2017 1421   WBC 6.7 09/12/2016 1235   RBC 4.51 08/15/2017 1421   HGB 14.2 08/15/2017 1421   HGB 12.4 09/12/2016 1235   HCT 42.8 08/15/2017 1421   HCT 38.1 09/12/2016 1235   PLT 206 08/15/2017 1421   PLT 216 09/12/2016 1235   MCV 94.9 08/15/2017 1421   MCV 86.6 09/12/2016 1235   MCH 31.5 08/15/2017 1421   MCHC 33.2 08/15/2017 1421   RDW 13.2 08/15/2017 1421   RDW 19.8 (H) 09/12/2016 1235   LYMPHSABS 1.7 08/15/2017 1421   LYMPHSABS 1.6 09/12/2016 1235   MONOABS 0.4 08/15/2017 1421   MONOABS 0.6 09/12/2016 1235   EOSABS 0.1 08/15/2017 1421   EOSABS 0.0 09/12/2016 1235   BASOSABS 0.0 08/15/2017 1421   BASOSABS 0.0 09/12/2016 1235   -------------------------------  Imaging from last 24 hours (if applicable):  Radiology interpretation: No results found.

## 2017-08-17 ENCOUNTER — Ambulatory Visit (HOSPITAL_COMMUNITY)
Admission: RE | Admit: 2017-08-17 | Discharge: 2017-08-17 | Disposition: A | Discharge status: Home / Self Care | Payer: Medicare PPO | Source: Ambulatory Visit | Attending: Medical | Admitting: Medical

## 2017-08-17 ENCOUNTER — Encounter (HOSPITAL_COMMUNITY): Payer: Self-pay

## 2017-08-17 DIAGNOSIS — C50411 Malignant neoplasm of upper-outer quadrant of right female breast: Secondary | ICD-10-CM | POA: Insufficient documentation

## 2017-08-17 DIAGNOSIS — R0789 Other chest pain: Secondary | ICD-10-CM | POA: Diagnosis not present

## 2017-08-17 DIAGNOSIS — Z170 Estrogen receptor positive status [ER+]: Secondary | ICD-10-CM

## 2017-08-17 DIAGNOSIS — J439 Emphysema, unspecified: Secondary | ICD-10-CM | POA: Diagnosis not present

## 2017-08-17 MED ORDER — IOHEXOL 300 MG/ML  SOLN
75.0000 mL | Freq: Once | INTRAMUSCULAR | Status: AC | PRN
  Administered 2017-08-17: 75 mL via INTRAVENOUS

## 2017-09-01 ENCOUNTER — Telehealth: Payer: Self-pay | Admitting: Oncology

## 2017-09-01 NOTE — Telephone Encounter (Signed)
GM PAL - per GM moved 9/12 lab/fu to 9/5. Spoke with patient she is aware.

## 2017-09-06 ENCOUNTER — Other Ambulatory Visit: Payer: Self-pay

## 2017-09-06 DIAGNOSIS — C50411 Malignant neoplasm of upper-outer quadrant of right female breast: Secondary | ICD-10-CM

## 2017-09-06 DIAGNOSIS — Z170 Estrogen receptor positive status [ER+]: Secondary | ICD-10-CM

## 2017-09-06 NOTE — Progress Notes (Signed)
[place]  [phone] [phone]     ID: [name] DOB: [birth year]  MR#: [id number]  GFU#:[id number]  Patient Care Team: [name], MD as PCP - General (Internal Medicine) Magrinat, [name], MD as Consulting Physician (Oncology) [name], MD as Consulting Physician (General Surgery) [name], MD as Consulting Physician (Radiation Oncology) Dr [name], surgery  CHIEF COMPLAINT: Estrogen receptor positive breast cancer  CURRENT TREATMENT:  tamoxifen   BREAST CANCER HISTORY: From the original intake note:  The patient herself noted a lump in her right breast late April 20 15th. She brought it to her primary care physician's attention and on 05/06/2013 had a bilateral mammography with tomography at [facility]. The breast density was category C. There was a new area of architectural distortion at the 11:00 position of the right breast with no other significant findings. Accordingly a right breast ultrasound was performed the same day. This identified a 1 cm irregular mass in the right breast at the 10:00 position. This correlated well with the palpable findings and biopsy of the mass was obtained the same day. This showed ([id number]) and invasive mammary carcinoma, with strong diffuse expression of E-cadherin both an invasive and in situ carcinomas, supporting a ductal etiology. Estrogen receptor was 100% positive, progesterone receptor was 96% positive, with strong staining intensity, the MIB-1 was 3%, and there was no HER-2 amplification, the signals ratio being 1.3 and the number per cell 2.5.  The patient underwent bilateral breast MRI 05/13/2013. This confirmed, in the upper outer quadrant of the right breast, several small nodules, associated with an area of postbiopsy hematoma. Taking together the area of enhancement and hematoma the largest measuring was 1.8 cm. There were no masses or abnormal enhancement seen in the left  breast and no suspicious lymph nodes.  The patient was seen in the multidisciplinary clinic by surgery and radiation oncology 05/15/2013. After discussion here and also with Dr. [name], and with a good understanding of the equivalent results between mastectomy and breast conservation surgery plus radiation, the patient opted for mastectomy (without planned reconstruction) and sentinel lymph node sampling. This was performed 06/13/2013 at [facility]. There was some postoperative bleeding requiring evacuation of a hematoma. The final pathology ([id number]) showed a 1.5 cm invasive mammary carcinoma (either at lobular breast cancer with the unusual feature of being E-cadherin positive, or a ductal breast cancer with lobular features), grade 2, with a positive intramammary lymph node. The sentinel lymph node and 4 additional axillary lymph nodes were negative. The tumor was again strongly estrogen and progesterone receptor positive, again HER-2 negative by FISH, with a low MIB-1-1, not read as 10%. Margins were negative.  On 07/03/2013 the patient was reevaluated by radiation oncology at they agreed that postmastectomy radiation was not indicated. They suggested consultation to medical oncology to consider adjuvant hormonal therapy.  The patient's subsequent history is as detailed below.  INTERVAL HISTORY: "Breanna Nelson" returns today for follow-up and treatment of her estrogen receptor positive breast cancer. She continues on tamoxifen,   Since her last visit, she completed a CT chest on 08/17/2017 showing: No evidence of recurrent or metastatic breast cancer. No findings to explain the patient's RIGHT axillary and chest wall pain.  No acute cardiopulmonary disease. Likely benign hemangioma or chondroblastoma involving the vertebral body and RIGHT pedicle of T7. No evidence of pathological fracture. If the patient has pain localizing to this level, MRI without and with contrast would be recommended in further  evaluation. Emphysema. (GUR42-H06.9)  She completed unilateral left routine screening mammography with tomography at [facility] on 07/14/2017 showing: Breast density category C. There was no evidence of malignancy.    REVIEW OF SYSTEMS: Breanna Nelson was doing some exercises that she devised herself, with a great deal of stretching, and she may have injured her right arm a little bit.  She was having pain in the right chest wall, which led to a CT scan of the chest.  Fortunately this was benign.  She was taking turmeric but that upset her stomach and she has stopped that.  Otherwise a detailed review of systems today was noncontributory  PAST MEDICAL HISTORY: Past Medical History:  Diagnosis Date  . Breast cancer of upper-outer quadrant of right female breast ([place])   . Osteoporosis     PAST SURGICAL HISTORY: Past Surgical History:  Procedure Laterality Date  . SIMPLE MASTECTOMY W/ SENTINEL NODE BIOPSY Right 06/13/13   by Dr. [name]    FAMILY HISTORY Family History  Problem Relation Age of Onset  . Testicular cancer Brother   . Skin cancer Mother    the patient's father died at the age of 3. The patient's mother died at the age of 53 from heart disease. She also had Alzheimer's disease. The patient had one brother who died in the age of 29 with testicular cancer. One sister died as a baby. There is no history of breast or ovarian cancer in the family.  GYNECOLOGIC HISTORY:  No LMP recorded. Patient is postmenopausal. Menarche age 13, first live birth age 22. The patient is GX P2. She does not recall when she went through menopause, but he was more than 10 years ago. She never took hormone replacement. She did take birth control pills for a brief period, with no complications  SOCIAL HISTORY:  About works at [employer]. At home it's just she and her dog [name]. Her husband died from complications of lymphoma earlier this year. He also had undergone a colectomy in 2004. "He had more than  200 polyps". The patient's daughter [name] lives in [place] where she works as a Engineer, structural. She also does [employer]. Her son Esperansa Sarabia is a truck driver. The patient has 4 grandchildren. The patient is a Psychologist, forensic.    ADVANCED DIRECTIVES: Not in place; the patient intends to name her daughter [name] as her healthcare power of attorney.   HEALTH MAINTENANCE: Social History   Tobacco Use  . Smoking status: Never Smoker  . Smokeless tobacco: Never Used  Substance Use Topics  . Alcohol use: No  . Drug use: No     Colonoscopy:  PAP:  Bone density:   Lipid panel:  No Known Allergies  Current Outpatient Medications  Medication Sig Dispense Refill  . Calcium 1200-1000 MG-UNIT CHEW Chew 1 capsule by mouth 2 (two) times daily.     . Cholecalciferol (VITAMIN D-3) 1000 UNITS CAPS Take 1 capsule by mouth 2 (two) times daily.     Marland Kitchen FLAXSEED, LINSEED, PO Take by mouth.    . Omega-3 Fatty Acids (FISH OIL) 500 MG CAPS Take 1 capsule by mouth 2 (two) times daily.     . tamoxifen (NOLVADEX) 20 MG tablet Take 1 tablet (20 mg total) by mouth daily. 90 tablet 3  . vitamin C (ASCORBIC ACID) 500 MG tablet Take 500 mg by mouth daily.     No current facility-administered medications for this visit.     OBJECTIVE: Middle-aged white woman who  appears stated age  1:   09/07/17 1248  BP: (!) 166/94  Pulse: 74  Resp: 18  Temp: 98.2 F (36.8 C)  SpO2: 100%     Body mass index is 19.77 kg/m.    ECOG FS:1 - Symptomatic but completely ambulatory  Sclerae unicteric, EOMs intact Oropharynx clear and moist No cervical or supraclavicular adenopathy Lungs no rales or rhonchi Heart regular rate and rhythm Abd soft, nontender, positive bowel sounds MSK no focal spinal tenderness, no upper extremity lymphedema Neuro: nonfocal, well oriented, appropriate affect Breasts: Status post right mastectomy with no evidence of local recurrence.  Left breast is benign.  Both axillae are  benign  LAB RESULTS:  CMP     Component Value Date/Time   NA 141 08/15/2017 1421   NA 139 09/12/2016 1235    I No results found for: SPEP  Lab Results  Component Value Date   WBC 5.4 09/07/2017      Chemistry      Component Value Date/Time   NA 141 08/15/2017 1421   NA 139 09/12/2016 1235      Component Value Date/Time   CALCIUM 9.0 08/15/2017 1421   CALCIUM 8.9 09/12/2016 1235       No results found for: LABCA2  No components found for: LABCA125  No results for input(s): INR in the last 168 hours.  Urinalysis No results found for: COLORURINE  STUDIES: Ct Chest W Contrast  Result Date: 08/17/2017 CLINICAL DATA:  Personal history of RIGHT breast cancer post mastectomy in June, 2015 with sentinel node removal, presenting now with approximate two-month history of RIGHT axillary pain and chest wall pain. EXAM: CT CHEST WITH CONTRAST TECHNIQUE: Multidetector CT imaging of the chest was performed during intravenous contrast administration. CONTRAST:  79m OMNIPAQUE IOHEXOL 300 MG/ML IV. COMPARISON:  No prior chest CT.  BILATERAL breast MRI 05/13/2013. FINDINGS: Cardiovascular: Heart size upper normal. Mild LAD coronary atherosclerosis. No pericardial effusion. Mild atherosclerosis involving the thoracic and proximal abdominal aorta without evidence of aneurysm or dissection (maximum diameter ascending thoracic aorta 3.8 cm). Mediastinum/Nodes: No pathologically enlarged mediastinal, hilar or axillary lymph nodes. No mediastinal masses. Normal-appearing esophagus. Subcentimeter nodules in the normal sized thyroid gland without evidence of a dominant nodule. Lungs/Pleura: Hyperinflation with emphysematous changes diffusely throughout both lungs. Mild cylindrical bronchiectasis involving the upper lobes (RIGHT greater than LEFT) and the lower lobes. Minimal scarring in the lower lobes. Pleuroparenchymal scarring in the POSTERIOR RIGHT UPPER LOBE along the major fissure. No  confluent airspace consolidation. No pulmonary parenchymal nodules or masses. Mucous plugging in a RIGHT LOWER LOBE bronchus mimics a very small nodule. No pleural effusions. Central airways patent with calcified tracheobronchial cartilages and no significant bronchial wall thickening. Upper Abdomen: Benign simple cyst arising from the UPPER pole the RIGHT kidney. No significant abnormalities involving the visualized upper abdomen allowing for the early portal venous phase of enhancement. Normal anatomic variant in that the LEFT lobe of the liver extends across the midline into the LEFT UPPER QUADRANT. Musculoskeletal: Osseous demineralization. Exaggeration of the usual thoracic kyphosis. Lucent lesion with internal sclerosis involving the vertebral body and the RIGHT pedicle of T7. DISH. No acute findings. Other: Prior RIGHT mastectomy with no evidence of mass in the RIGHT chest wall. No mass or pathologic lymphadenopathy in the RIGHT axilla. IMPRESSION: 1. No evidence of recurrent or metastatic breast cancer. No findings to explain the patient's RIGHT axillary and chest wall pain. 2.  No acute cardiopulmonary disease. 3. Likely benign hemangioma or  chondroblastoma involving the vertebral body and RIGHT pedicle of T7. No evidence of pathological fracture. If the patient has pain localizing to this level, MRI without and with contrast would be recommended in further evaluation. Emphysema. ([id number]) Electronically Signed   By: Evangeline Dakin M.D.   On: 08/17/2017 15:04   She completed unilateral left routine screening mammography with tomography at [facility] on 07/14/2017 showing: Breast density category C. There was no evidence of malignancy.   ASSESSMENT: 67 y.o. [place] woman status post right mastectomy and sentinel lymph node sampling 06/13/2013 for an upper outer quadrant pT2 pN1, stage IIB invasive mammary carcinoma (either an invasive ductal carcinoma with lobular features, or a lobular carcinoma  that was E-cadherin strongly positive), estrogen and progesterone receptor positive, HER-2 nonamplified, with a low MIB-1  (1) there was no indication for postmastectomy radiation  (2) the patient opted against adjuvant chemotherapy  (3) tamoxifen started September 2015.  (4) osteoporosis by bone density scan 09/18/2009   PLAN: Breanna Nelson is now a little over 4 years out from definitive surgery for breast cancer with no evidence of disease recurrence.  This is very favorable.  She is tolerating tamoxifen well and the plan is to continue that at least one more year.  Today I gave her information on our survivorship program in case she would like to continue follow-up here beyond next year  She wanted to know why she did not receive chemotherapy.  I reviewed my note from August 2015 and I discussed in that the data we then had regarding survival benefit from chemotherapy.  She did not feel motivated to receive the chemo for those thoughts and I was comfortable with her not receiving it.  She will see me again in 1 year.  That may be her "graduation" visit  She knows to call for any other issues that may develop before then.    Magrinat, [name], MD  09/07/17 1:20 PM Medical Oncology and Hematology [facility] [address] [place], [place] [address] Tel. [phone]    Fax. [phone]  Alice Rieger, am acting as scribe for Chauncey Cruel MD.  I, Lurline Del MD, have reviewed the above documentation for accuracy and completeness, and I agree with the above.

## 2017-09-07 ENCOUNTER — Telehealth: Payer: Self-pay | Admitting: Oncology

## 2017-09-07 ENCOUNTER — Inpatient Hospital Stay (HOSPITAL_BASED_OUTPATIENT_CLINIC_OR_DEPARTMENT_OTHER): Payer: Medicare PPO | Admitting: Oncology

## 2017-09-07 ENCOUNTER — Inpatient Hospital Stay: Payer: Medicare PPO | Attending: Medical

## 2017-09-07 ENCOUNTER — Telehealth: Payer: Self-pay | Admitting: *Deleted

## 2017-09-07 VITALS — BP 166/94 | HR 74 | Temp 98.2°F | Resp 18 | Ht 66.0 in | Wt 122.5 lb

## 2017-09-07 DIAGNOSIS — C50411 Malignant neoplasm of upper-outer quadrant of right female breast: Secondary | ICD-10-CM | POA: Insufficient documentation

## 2017-09-07 DIAGNOSIS — Z808 Family history of malignant neoplasm of other organs or systems: Secondary | ICD-10-CM

## 2017-09-07 DIAGNOSIS — Z79810 Long term (current) use of selective estrogen receptor modulators (SERMs): Secondary | ICD-10-CM

## 2017-09-07 DIAGNOSIS — Z170 Estrogen receptor positive status [ER+]: Secondary | ICD-10-CM

## 2017-09-07 DIAGNOSIS — R0789 Other chest pain: Secondary | ICD-10-CM | POA: Diagnosis present

## 2017-09-07 DIAGNOSIS — M810 Age-related osteoporosis without current pathological fracture: Secondary | ICD-10-CM | POA: Insufficient documentation

## 2017-09-07 LAB — CMP (CANCER CENTER ONLY)
ALT: 19 U/L (ref 0–44)
AST: 32 U/L (ref 15–41)
Albumin: 3.7 g/dL (ref 3.5–5.0)
Alkaline Phosphatase: 48 U/L (ref 38–126)
Anion gap: 8 (ref 5–15)
BUN: 12 mg/dL (ref 8–23)
CO2: 29 mmol/L (ref 22–32)
Calcium: 9 mg/dL (ref 8.9–10.3)
Chloride: 102 mmol/L (ref 98–111)
Creatinine: 0.8 mg/dL (ref 0.44–1.00)
GFR, Est AFR Am: >60 mL/min (ref >60)
GFR, Estimated: >60 mL/min (ref >60)
Glucose, Bld: 91 mg/dL (ref 70–99)
Potassium: 4.5 mmol/L (ref 3.5–5.1)
Sodium: 139 mmol/L (ref 135–145)
Total Bilirubin: 0.4 mg/dL (ref 0.3–1.2)
Total Protein: 6.2 g/dL — ABNORMAL LOW (ref 6.5–8.1)

## 2017-09-07 LAB — CBC WITH DIFFERENTIAL (CANCER CENTER ONLY)
Basophils Absolute: 0 10*3/uL (ref 0.0–0.1)
Basophils Relative: 1 %
Eosinophils Absolute: 0 10*3/uL (ref 0.0–0.5)
Eosinophils Relative: 1 %
HCT: 38.1 % (ref 34.8–46.6)
Hemoglobin: 12.7 g/dL (ref 11.6–15.9)
Lymphocytes Relative: 27 %
Lymphs Abs: 1.4 10*3/uL (ref 0.9–3.3)
MCH: 31.4 pg (ref 25.1–34.0)
MCHC: 33.3 g/dL (ref 31.5–36.0)
MCV: 94.3 fL (ref 79.5–101.0)
Monocytes Absolute: 0.6 10*3/uL (ref 0.1–0.9)
Monocytes Relative: 11 %
Neutro Abs: 3.3 10*3/uL (ref 1.5–6.5)
Neutrophils Relative %: 60 %
Platelet Count: 204 10*3/uL (ref 145–400)
RBC: 4.04 MIL/uL (ref 3.70–5.45)
RDW: 13.2 % (ref 11.2–14.5)
WBC Count: 5.4 10*3/uL (ref 3.9–10.3)

## 2017-09-07 LAB — FERRITIN: Ferritin: 19 ng/mL (ref 11–307)

## 2017-09-07 MED ORDER — TAMOXIFEN CITRATE 20 MG PO TABS: 20.0000 mg | ORAL_TABLET | Freq: Every day | ORAL | 3 refills | Status: DC

## 2017-09-07 NOTE — Telephone Encounter (Signed)
Incorrect provider

## 2017-09-07 NOTE — Telephone Encounter (Signed)
Gave pt avs and calendar  °

## 2017-09-14 ENCOUNTER — Ambulatory Visit: Payer: Medicare PPO | Admitting: Oncology

## 2017-09-14 ENCOUNTER — Other Ambulatory Visit: Payer: Medicare PPO

## 2018-03-19 ENCOUNTER — Other Ambulatory Visit: Payer: Self-pay | Admitting: Oncology

## 2018-03-19 DIAGNOSIS — Z170 Estrogen receptor positive status [ER+]: Secondary | ICD-10-CM

## 2018-03-19 DIAGNOSIS — C50411 Malignant neoplasm of upper-outer quadrant of right female breast: Secondary | ICD-10-CM

## 2018-05-04 DIAGNOSIS — I10 Essential (primary) hypertension: Secondary | ICD-10-CM | POA: Diagnosis not present

## 2018-05-04 DIAGNOSIS — M810 Age-related osteoporosis without current pathological fracture: Secondary | ICD-10-CM | POA: Diagnosis not present

## 2018-05-11 DIAGNOSIS — Q447 Other congenital malformations of liver: Secondary | ICD-10-CM | POA: Diagnosis not present

## 2018-05-11 DIAGNOSIS — Z52008 Unspecified donor, other blood: Secondary | ICD-10-CM | POA: Diagnosis not present

## 2018-05-11 DIAGNOSIS — C50411 Malignant neoplasm of upper-outer quadrant of right female breast: Secondary | ICD-10-CM | POA: Diagnosis not present

## 2018-05-11 DIAGNOSIS — Z0000 Encounter for general adult medical examination without abnormal findings: Secondary | ICD-10-CM | POA: Diagnosis not present

## 2018-05-11 DIAGNOSIS — N281 Cyst of kidney, acquired: Secondary | ICD-10-CM | POA: Diagnosis not present

## 2018-05-11 DIAGNOSIS — M810 Age-related osteoporosis without current pathological fracture: Secondary | ICD-10-CM | POA: Diagnosis not present

## 2018-05-11 DIAGNOSIS — R1084 Generalized abdominal pain: Secondary | ICD-10-CM | POA: Diagnosis not present

## 2018-06-05 ENCOUNTER — Encounter: Payer: Self-pay | Admitting: Sports Medicine

## 2018-06-05 ENCOUNTER — Ambulatory Visit (INDEPENDENT_AMBULATORY_CARE_PROVIDER_SITE_OTHER): Payer: Medicare PPO

## 2018-06-05 ENCOUNTER — Ambulatory Visit (INDEPENDENT_AMBULATORY_CARE_PROVIDER_SITE_OTHER): Payer: Medicare PPO | Admitting: Sports Medicine

## 2018-06-05 ENCOUNTER — Ambulatory Visit: Payer: Medicare PPO

## 2018-06-05 ENCOUNTER — Other Ambulatory Visit: Payer: Self-pay

## 2018-06-05 ENCOUNTER — Ambulatory Visit: Payer: Medicare PPO | Admitting: Sports Medicine

## 2018-06-05 VITALS — Temp 97.9°F

## 2018-06-05 DIAGNOSIS — M205X1 Other deformities of toe(s) (acquired), right foot: Secondary | ICD-10-CM

## 2018-06-05 DIAGNOSIS — M79671 Pain in right foot: Secondary | ICD-10-CM

## 2018-06-05 DIAGNOSIS — M779 Enthesopathy, unspecified: Secondary | ICD-10-CM | POA: Diagnosis not present

## 2018-06-05 DIAGNOSIS — L84 Corns and callosities: Secondary | ICD-10-CM

## 2018-06-05 MED ORDER — TRIAMCINOLONE ACETONIDE 10 MG/ML IJ SUSP
10.0000 mg | Freq: Once | INTRAMUSCULAR | Status: AC
  Administered 2018-06-05: 10 mg

## 2018-06-05 NOTE — Progress Notes (Signed)
Subjective: Breanna Nelson is a 68 y.o. female patient who presents to office for evaluation of Right foot pain. Patient complains of progressive pain especially over the last week in the right foot at the top across the bases of toes of which she thinks is coming from her walking differently and lifting up her toes due to pain at 1st toe callus. Ranks pain 7/10 and is now interferring with daily activities. Patient has tried callus pad with no relief in symptoms. Patient denies any other pedal complaints. Denies injury/trip/fall/sprain but does state that she has been trying to walk more for exercise.   Patient Active Problem List   Diagnosis Date Noted  . S/P mastectomy 06/14/2013  . Bleeding 06/13/2013  . Malignant neoplasm of upper-outer quadrant of right breast in female, estrogen receptor positive ([place]) 05/10/2013    Current Outpatient Medications on File Prior to Visit  Medication Sig Dispense Refill  . Calcium 1200-1000 MG-UNIT CHEW Chew 1 capsule by mouth 2 (two) times daily.     . Cholecalciferol (VITAMIN D-3) 1000 UNITS CAPS Take 1 capsule by mouth 2 (two) times daily.     Marland Kitchen FLAXSEED, LINSEED, PO Take by mouth.    . Omega-3 Fatty Acids (FISH OIL) 500 MG CAPS Take 1 capsule by mouth 2 (two) times daily.     . tamoxifen (NOLVADEX) 20 MG tablet TAKE 1 TABLET EVERY DAY 90 tablet 3  . vitamin C (ASCORBIC ACID) 500 MG tablet Take 500 mg by mouth daily.     No current facility-administered medications on file prior to visit.     No Known Allergies  Objective:  General: Alert and oriented x3 in no acute distress  Dermatology: Callus plantar hallux Right foot. No open lesions bilateral lower extremities, no webspace macerations, no ecchymosis bilateral, all nails x 10 are well manicured.  Vascular: Dorsalis Pedis and Posterior Tibial pedal pulses palpable, 1/4 bilateral, Capillary Fill Time 3 seconds,(+) pedal hair growth bilateral, no edema bilateral lower extremities, Temperature  gradient within normal limits.  Neurology: Johney Maine sensation intact via light touch bilateral.  Musculoskeletal: Mild tenderness with palpation at lesser MTPJ 4>5,3,2 on right,No pain with calf compression bilateral. There is decreased 1st MPJ rom Right>Left with functional limitus noted on weightbearing exam and hallux extensus deformity. Strength within normal limits in all groups bilateral.   Gait: Antalgic gait  Xrays  Right Foot   Impression:narrowing at 1st MTPJ and dorsal midfoot supportive of arthritis, extensus position of 1st toe. No other acute findings noted.   Assessment and Plan: Problem List Items Addressed This Visit    None    Visit Diagnoses    Pain in right foot    -  Primary   Tendonitis       Relevant Medications   triamcinolone acetonide (KENALOG) 10 MG/ML injection 10 mg (Completed) (Start on 06/05/2018  7:00 PM)   Other Relevant Orders   DG Foot Complete Right (Completed)   Capsulitis       Relevant Medications   triamcinolone acetonide (KENALOG) 10 MG/ML injection 10 mg (Completed) (Start on 06/05/2018  7:00 PM)   Callus       Hallux limitus of right foot       Hallux extensus, acquired, right           -Complete examination performed -Xrays reviewed -Discussed treatement options for tendonitis/capsulitis at lesser MTPJ and callus plantar hallux on right -After oral consent and aseptic prep, injected a mixture containing 1 ml of 2%  plain lidocaine, 1 ml 0.5% plain marcaine, 0.5 ml of kenalog 10 and 0.5 ml of dexamethasone phosphate into 4th MTPJ at area of most pain on right without complication. Post-injection care discussed with patient.  -Advised to refrain from walking this week may resume next week and to walk in tennis shoes and not sandals -At no charge mechanically debrided callus plantar hallux on right and advised patient to refrain from using OTC corn pads -Patient to return to office as needed or sooner if condition worsens.  Landis Martins,  DPM

## 2018-07-10 ENCOUNTER — Ambulatory Visit: Payer: Medicare PPO | Admitting: Sports Medicine

## 2018-08-02 ENCOUNTER — Encounter: Payer: Self-pay | Admitting: Oncology

## 2018-08-02 DIAGNOSIS — Z1231 Encounter for screening mammogram for malignant neoplasm of breast: Secondary | ICD-10-CM | POA: Diagnosis not present

## 2018-08-02 DIAGNOSIS — Z853 Personal history of malignant neoplasm of breast: Secondary | ICD-10-CM | POA: Diagnosis not present

## 2018-08-17 ENCOUNTER — Other Ambulatory Visit: Payer: Self-pay

## 2018-08-17 DIAGNOSIS — Z20822 Contact with and (suspected) exposure to covid-19: Secondary | ICD-10-CM

## 2018-08-18 LAB — NOVEL CORONAVIRUS, NAA: SARS-CoV-2, NAA: NOT DETECTED

## 2018-09-05 ENCOUNTER — Other Ambulatory Visit: Payer: Self-pay | Admitting: *Deleted

## 2018-09-05 DIAGNOSIS — Z170 Estrogen receptor positive status [ER+]: Secondary | ICD-10-CM

## 2018-09-05 DIAGNOSIS — C50411 Malignant neoplasm of upper-outer quadrant of right female breast: Secondary | ICD-10-CM

## 2018-09-05 NOTE — Progress Notes (Signed)
Breanna Nelson  [phone] [phone]     ID: Breanna Nelson DOB: 03-Jan-1951  MR#: [id number]  CSN#:[id number]  Patient Care Team: [name], MD as PCP - General (Internal Medicine) Tekeyah Santiago, [name], MD as Consulting Physician (Oncology) [name], MD as Consulting Physician (General Surgery) [name], MD as Consulting Physician (Radiation Oncology) OTHER MD: Dr [name], surgery  CHIEF COMPLAINT: Estrogen receptor positive breast cancer  CURRENT TREATMENT:  tamoxifen   INTERVAL HISTORY: "Breanna Nelson" returns today for follow-up and treatment of her estrogen receptor positive breast cancer.   She continues on tamoxifen with good tolerance.  She does not have problems with hot flashes.  She has mild vaginal dryness discharge issues.  Since her last visit, she underwent bilateral diagnostic mammography with tomography at [facility] (mid August).  She was told there was no evidence of malignancy but that her breasts were dense.  She also underwent coronavirus testing on 08/17/2018, which was negative.   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  She does that twice a week.  She feels tired sometimes.  She has discomfort in the right axilla and she worries that this might be breast cancer related.  She is not taking walks or otherwise exercising.  She has an excellent diet except she likes to eat sweets she says.  A detailed review of systems today was otherwise stable.  BREAST CANCER HISTORY: From the original intake note:  The patient herself noted a lump in her right breast late April 20 15th. She brought it to her primary care physician's attention and on 05/06/2013 had a bilateral mammography with tomography at [facility]. The breast density was category C. There was a new area of architectural distortion at the 11:00 position of the right breast with no other significant findings. Accordingly a right breast ultrasound was  performed the same day. This identified a 1 cm irregular mass in the right breast at the 10:00 position. This correlated well with the palpable findings and biopsy of the mass was obtained the same day. This showed (SAA [phone]) and invasive mammary carcinoma, with strong diffuse expression of E-cadherin both an invasive and in situ carcinomas, supporting a ductal etiology. Estrogen receptor was 100% positive, progesterone receptor was 96% positive, with strong staining intensity, the MIB-1 was 3%, and there was no HER-2 amplification, the signals ratio being 1.3 and the number per cell 2.5.  The patient underwent bilateral breast MRI 05/13/2013. This confirmed, in the upper outer quadrant of the right breast, several small nodules, associated with an area of postbiopsy hematoma. Taking together the area of enhancement and hematoma the largest measuring was 1.8 cm. There were no masses or abnormal enhancement seen in the left breast and no suspicious lymph nodes.  The patient was seen in the multidisciplinary clinic by surgery and radiation oncology 05/15/2013. After discussion here and also with Dr. [name], and with a good understanding of the equivalent results between mastectomy and breast conservation surgery plus radiation, the patient opted for mastectomy (without planned reconstruction) and sentinel lymph node sampling. This was performed 06/13/2013 at [facility]. There was some postoperative bleeding requiring evacuation of a hematoma. The final pathology ([id number]) showed a 1.5 cm invasive mammary carcinoma (either at lobular breast cancer with the unusual feature of being E-cadherin positive, or a ductal breast cancer with lobular features), grade 2, with a positive intramammary lymph node. The sentinel lymph node and 4 additional axillary lymph nodes were negative.  The tumor was again strongly estrogen and progesterone receptor positive, again HER-2 negative by FISH, with a low MIB-1-1, not  read as 10%. Margins were negative.  On 07/03/2013 the patient was reevaluated by radiation oncology at they agreed that postmastectomy radiation was not indicated. They suggested consultation to medical oncology to consider adjuvant hormonal therapy.  The patient's subsequent history is as detailed below.   PAST MEDICAL HISTORY: Past Medical History:  Diagnosis Date  . Breast cancer of upper-outer quadrant of right female breast ([place])   . Osteoporosis     PAST SURGICAL HISTORY: Past Surgical History:  Procedure Laterality Date  . SIMPLE MASTECTOMY W/ SENTINEL NODE BIOPSY Right 06/13/13   by Dr. [name]    FAMILY HISTORY Family History  Problem Relation Age of Onset  . Testicular cancer Brother   . Skin cancer Mother    the patient's father died at the age of 50. The patient's mother died at the age of 4 from heart disease. She also had Alzheimer's disease. The patient had one brother who died in the age of 17 with testicular cancer. One sister died as a baby. There is no history of breast or ovarian cancer in the family.   GYNECOLOGIC HISTORY:  No LMP recorded. Patient is postmenopausal. Menarche age 71, first live birth age 21. The patient is GX P2. She does not recall when she went through menopause, but he was more than 10 years ago. She never took hormone replacement. She did take birth control pills for a brief period, with no complications   SOCIAL HISTORY:  Breanna Nelson works at [employer]. At home it's just she and her dog [name]. Her husband died from complications of lymphoma. He also had undergone a colectomy in 2004. "He had more than 200 polyps". The patient's daughter Breanna Nelson lives in [place] where she works as a Engineer, structural. She also does [employer]. Her son Breanna Nelson is a truck driver.  He recently underwent colonoscopy.  The patient has 4 grandchildren. The patient is a [employer].--[name] tells me she is fianc to [name], who is a church  member with her.  Unfortunately he has significant COPD problems and is on oxygen.  That is why she was tested for coronavirus earlier this year.    ADVANCED DIRECTIVES: Not in place; the patient intends to name her daughter Breanna Nelson as her healthcare power of attorney.   HEALTH MAINTENANCE: Social History   Tobacco Use  . Smoking status: Never Smoker  . Smokeless tobacco: Never Used  Substance Use Topics  . Alcohol use: No  . Drug use: No     Colonoscopy:  PAP:  Bone density:   Lipid panel:  No Known Allergies  Current Outpatient Medications  Medication Sig Dispense Refill  . Calcium 1200-1000 MG-UNIT CHEW Chew 1 capsule by mouth 2 (two) times daily.     . Cholecalciferol (VITAMIN D-3) 1000 UNITS CAPS Take 1 capsule by mouth 2 (two) times daily.     Marland Kitchen FLAXSEED, LINSEED, PO Take by mouth.    . tamoxifen (NOLVADEX) 20 MG tablet Take 1 tablet (20 mg total) by mouth daily. 90 tablet 4  . vitamin C (ASCORBIC ACID) 500 MG tablet Take 500 mg by mouth daily.     No current facility-administered medications for this visit.     OBJECTIVE: Middle-aged white woman in no acute distress  Vitals:   09/06/18 1240  BP: (!) 169/98  Pulse: 74  Resp: 18  Temp: 99.1 F (37.3 C)  SpO2: 100%     Body mass index is 19.59 kg/m.    ECOG FS:1 - Symptomatic but completely ambulatory  Sclerae unicteric, EOMs intact Wearing a mask No cervical or supraclavicular adenopathy Lungs no rales or rhonchi Heart regular rate and rhythm Abd soft, nontender, positive bowel sounds MSK no focal spinal tenderness, no upper extremity lymphedema Neuro: nonfocal, well oriented, appropriate affect Breasts: The right breast is status post mastectomy, with no evidence of chest wall recurrence.  The left breast is benign.  Both axillae are benign   LAB RESULTS:  CMP     Component Value Date/Time   NA 139 09/07/2017 1221   NA 139 09/12/2016 1235    I No results found for: SPEP  Lab Results   Component Value Date   WBC 5.6 09/06/2018      Chemistry      Component Value Date/Time   NA 139 09/07/2017 1221   NA 139 09/12/2016 1235      Component Value Date/Time   CALCIUM 9.0 09/07/2017 1221   CALCIUM 8.9 09/12/2016 1235       No results found for: LABCA2  No components found for: LABCA125  No results for input(s): INR in the last 168 hours.  Urinalysis No results found for: COLORURINE   STUDIES: No results found.   ASSESSMENT: 68 y.o.  woman status post right mastectomy and sentinel lymph node sampling 06/13/2013 for an upper outer quadrant pT2 pN1, stage IIB invasive mammary carcinoma (either an invasive ductal carcinoma with lobular features, or a lobular carcinoma that was E-cadherin strongly positive), estrogen and progesterone receptor positive, HER-2 nonamplified, with a low MIB-1  (1) there was no indication for postmastectomy radiation  (2) the patient opted against adjuvant chemotherapy  (3) tamoxifen started September 2015.  (4) osteoporosis by bone density scan 09/18/2009   PLAN: Breanna Nelson is now a little over 5 years out from definitive surgery for her breast cancer with no evidence of disease recurrence.  This is very favorable.  She is tolerating tamoxifen well.  She is interested in continuing an additional 5 years because it would make you feel more safe and indeed it will further reduce her risk of recurrence.  In patients were lymph node positive I think this is a good decision.  She is taking appropriate pandemic precautions.  I reassured her that the discomfort she is feeling in her right axilla is really related to prior surgery and not to recurrent breast cancer.  She will return to see me in 1 year.  She knows to call for any other issues that may develop before that visit.   Alecxander Mainwaring, [name], MD  09/06/18 1:09 PM Medical Oncology and Hematology [facility] [address] [place], [place] [address] Tel.  [phone]    Fax. [phone]   I, Wilburn Mylar, am acting as scribe for Dr. [name]. Laniqua Torrens.  I, Lurline Del MD, have reviewed the above documentation for accuracy and completeness, and I agree with the above.

## 2018-09-06 ENCOUNTER — Other Ambulatory Visit: Payer: Self-pay

## 2018-09-06 ENCOUNTER — Inpatient Hospital Stay: Payer: Medicare PPO | Admitting: Oncology

## 2018-09-06 ENCOUNTER — Inpatient Hospital Stay: Payer: Medicare PPO | Attending: Oncology

## 2018-09-06 VITALS — BP 169/98 | HR 74 | Temp 99.1°F | Resp 18 | Wt 121.4 lb

## 2018-09-06 DIAGNOSIS — C50411 Malignant neoplasm of upper-outer quadrant of right female breast: Secondary | ICD-10-CM | POA: Diagnosis not present

## 2018-09-06 DIAGNOSIS — Z79810 Long term (current) use of selective estrogen receptor modulators (SERMs): Secondary | ICD-10-CM | POA: Insufficient documentation

## 2018-09-06 DIAGNOSIS — M818 Other osteoporosis without current pathological fracture: Secondary | ICD-10-CM | POA: Diagnosis not present

## 2018-09-06 DIAGNOSIS — Z170 Estrogen receptor positive status [ER+]: Secondary | ICD-10-CM | POA: Diagnosis not present

## 2018-09-06 LAB — CMP (CANCER CENTER ONLY)
ALT: 18 U/L (ref 0–44)
AST: 30 U/L (ref 15–41)
Albumin: 4 g/dL (ref 3.5–5.0)
Alkaline Phosphatase: 47 U/L (ref 38–126)
Anion gap: 10 (ref 5–15)
BUN: 11 mg/dL (ref 8–23)
CO2: 29 mmol/L (ref 22–32)
Calcium: 9 mg/dL (ref 8.9–10.3)
Chloride: 101 mmol/L (ref 98–111)
Creatinine: 0.73 mg/dL (ref 0.44–1.00)
GFR, Est AFR Am: >60 mL/min (ref >60)
GFR, Estimated: >60 mL/min (ref >60)
Glucose, Bld: 89 mg/dL (ref 70–99)
Potassium: 4.4 mmol/L (ref 3.5–5.1)
Sodium: 140 mmol/L (ref 135–145)
Total Bilirubin: 0.6 mg/dL (ref 0.3–1.2)
Total Protein: 6.6 g/dL (ref 6.5–8.1)

## 2018-09-06 LAB — CBC WITH DIFFERENTIAL (CANCER CENTER ONLY)
Abs Immature Granulocytes: 0.01 10*3/uL (ref 0.00–0.07)
Basophils Absolute: 0.1 10*3/uL (ref 0.0–0.1)
Basophils Relative: 1 %
Eosinophils Absolute: 0.1 10*3/uL (ref 0.0–0.5)
Eosinophils Relative: 1 %
HCT: 44.1 % (ref 36.0–46.0)
Hemoglobin: 14.5 g/dL (ref 12.0–15.0)
Immature Granulocytes: 0 %
Lymphocytes Relative: 27 %
Lymphs Abs: 1.5 10*3/uL (ref 0.7–4.0)
MCH: 31.5 pg (ref 26.0–34.0)
MCHC: 32.9 g/dL (ref 30.0–36.0)
MCV: 95.7 fL (ref 80.0–100.0)
Monocytes Absolute: 0.6 10*3/uL (ref 0.1–1.0)
Monocytes Relative: 11 %
Neutro Abs: 3.4 10*3/uL (ref 1.7–7.7)
Neutrophils Relative %: 60 %
Platelet Count: 186 10*3/uL (ref 150–400)
RBC: 4.61 MIL/uL (ref 3.87–5.11)
RDW: 12.7 % (ref 11.5–15.5)
WBC Count: 5.6 10*3/uL (ref 4.0–10.5)
nRBC: 0 % (ref 0.0–0.2)

## 2018-09-06 MED ORDER — TAMOXIFEN CITRATE 20 MG PO TABS: 20.0000 mg | ORAL_TABLET | Freq: Every day | ORAL | 4 refills | Status: DC

## 2018-09-07 ENCOUNTER — Telehealth: Payer: Self-pay | Admitting: Oncology

## 2018-09-07 NOTE — Telephone Encounter (Signed)
I talk with patient regarding schedule  

## 2018-10-01 ENCOUNTER — Encounter (HOSPITAL_COMMUNITY): Payer: Self-pay | Admitting: Emergency Medicine

## 2018-10-01 ENCOUNTER — Other Ambulatory Visit: Payer: Self-pay

## 2018-10-01 ENCOUNTER — Emergency Department (HOSPITAL_COMMUNITY)
Admission: EM | Admit: 2018-10-01 | Discharge: 2018-10-01 | Disposition: A | Discharge status: Home / Self Care | Payer: Medicare PPO | Attending: Emergency Medicine | Admitting: Emergency Medicine

## 2018-10-01 ENCOUNTER — Emergency Department (HOSPITAL_COMMUNITY): Payer: Medicare PPO

## 2018-10-01 DIAGNOSIS — S20211A Contusion of right front wall of thorax, initial encounter: Secondary | ICD-10-CM | POA: Insufficient documentation

## 2018-10-01 DIAGNOSIS — Y92009 Unspecified place in unspecified non-institutional (private) residence as the place of occurrence of the external cause: Secondary | ICD-10-CM | POA: Diagnosis not present

## 2018-10-01 DIAGNOSIS — Z853 Personal history of malignant neoplasm of breast: Secondary | ICD-10-CM | POA: Insufficient documentation

## 2018-10-01 DIAGNOSIS — Y939 Activity, unspecified: Secondary | ICD-10-CM | POA: Diagnosis not present

## 2018-10-01 DIAGNOSIS — Y999 Unspecified external cause status: Secondary | ICD-10-CM | POA: Diagnosis not present

## 2018-10-01 DIAGNOSIS — S0003XA Contusion of scalp, initial encounter: Secondary | ICD-10-CM | POA: Insufficient documentation

## 2018-10-01 DIAGNOSIS — R0781 Pleurodynia: Secondary | ICD-10-CM | POA: Diagnosis not present

## 2018-10-01 DIAGNOSIS — W19XXXA Unspecified fall, initial encounter: Secondary | ICD-10-CM | POA: Insufficient documentation

## 2018-10-01 DIAGNOSIS — S299XXA Unspecified injury of thorax, initial encounter: Secondary | ICD-10-CM | POA: Diagnosis not present

## 2018-10-01 DIAGNOSIS — R55 Syncope and collapse: Secondary | ICD-10-CM | POA: Diagnosis not present

## 2018-10-01 LAB — COMPREHENSIVE METABOLIC PANEL WITH GFR
ALT: 21 U/L (ref 0–44)
AST: 28 U/L (ref 15–41)
Albumin: 3.8 g/dL (ref 3.5–5.0)
Alkaline Phosphatase: 41 U/L (ref 38–126)
Anion gap: 10 (ref 5–15)
BUN: 13 mg/dL (ref 8–23)
CO2: 27 mmol/L (ref 22–32)
Calcium: 8.9 mg/dL (ref 8.9–10.3)
Chloride: 101 mmol/L (ref 98–111)
Creatinine, Ser: 0.66 mg/dL (ref 0.44–1.00)
GFR calc Af Amer: >60 mL/min
GFR calc non Af Amer: >60 mL/min
Glucose, Bld: 103 mg/dL — ABNORMAL HIGH (ref 70–99)
Potassium: 4.3 mmol/L (ref 3.5–5.1)
Sodium: 138 mmol/L (ref 135–145)
Total Bilirubin: 0.6 mg/dL (ref 0.3–1.2)
Total Protein: 6.6 g/dL (ref 6.5–8.1)

## 2018-10-01 LAB — CBC WITH DIFFERENTIAL/PLATELET
Abs Immature Granulocytes: 0.01 10*3/uL (ref 0.00–0.07)
Basophils Absolute: 0 10*3/uL (ref 0.0–0.1)
Basophils Relative: 1 %
Eosinophils Absolute: 0.1 10*3/uL (ref 0.0–0.5)
Eosinophils Relative: 1 %
HCT: 39.5 % (ref 36.0–46.0)
Hemoglobin: 12.8 g/dL (ref 12.0–15.0)
Immature Granulocytes: 0 %
Lymphocytes Relative: 16 %
Lymphs Abs: 1.3 10*3/uL (ref 0.7–4.0)
MCH: 31.5 pg (ref 26.0–34.0)
MCHC: 32.4 g/dL (ref 30.0–36.0)
MCV: 97.3 fL (ref 80.0–100.0)
Monocytes Absolute: 0.6 10*3/uL (ref 0.1–1.0)
Monocytes Relative: 8 %
Neutro Abs: 6 10*3/uL (ref 1.7–7.7)
Neutrophils Relative %: 74 %
Platelets: 188 10*3/uL (ref 150–400)
RBC: 4.06 MIL/uL (ref 3.87–5.11)
RDW: 12.8 % (ref 11.5–15.5)
WBC: 7.9 10*3/uL (ref 4.0–10.5)
nRBC: 0 % (ref 0.0–0.2)

## 2018-10-01 NOTE — Discharge Instructions (Addendum)
Eat and Drink plenty of fluids.   Rest.  See your Physician for recheck

## 2018-10-01 NOTE — ED Triage Notes (Signed)
Pt states that she gave blood on Friday and that night she was on a bar stool and passed out. She hit her head and her back.

## 2018-10-01 NOTE — ED Notes (Signed)
Patient states her back hurts ever since she feel off a stool at home.  She c/o mid back pain.  States she does not feel faint or dizzy at this time, only back pain.

## 2018-10-01 NOTE — ED Provider Notes (Signed)
[facility] EMERGENCY DEPARTMENT Provider Note   CSN: [id number] Arrival date & time: 10/01/18  [id number]     History   Chief Complaint Chief Complaint  Patient presents with  . Back Pain  . Loss of Consciousness    HPI Breanna Nelson is a 68 y.o. female.     The history is provided by the patient. No language interpreter was used.  Loss of Consciousness Episode history:  Single Most recent episode:  Today Timing:  Constant Progression:  Resolved Chronicity:  New Context: blood draw   Witnessed: no   Relieved by:  Nothing Worsened by:  Nothing Ineffective treatments:  Activity Associated symptoms: no focal weakness   Risk factors: no coronary artery disease   Pt gave blood today.  Pt reports when she got home she passed out and hit her right ribs and her head.  Pt complains of rib soreness.  Pt reports her hemoglobin was 14 before she gave blood   Past Medical History:  Diagnosis Date  . Breast cancer of upper-outer quadrant of right female breast ([place])   . Osteoporosis     Patient Active Problem List   Diagnosis Date Noted  . S/P mastectomy 06/14/2013  . Bleeding 06/13/2013  . Malignant neoplasm of upper-outer quadrant of right breast in female, estrogen receptor positive ([place]) 05/10/2013    Past Surgical History:  Procedure Laterality Date  . SIMPLE MASTECTOMY W/ SENTINEL NODE BIOPSY Right 06/13/13   by Dr. [name]     OB History   No obstetric history on file.      Home Medications    Prior to Admission medications   Medication Sig Start Date End Date Taking? Authorizing Provider  Calcium 1200-1000 MG-UNIT CHEW Chew 1 capsule by mouth 2 (two) times daily.     [provider]  Cholecalciferol (VITAMIN D-3) 1000 UNITS CAPS Take 1 capsule by mouth 2 (two) times daily.     [provider]  FLAXSEED, LINSEED, PO Take by mouth.    [provider]  tamoxifen (NOLVADEX) 20 MG tablet Take 1 tablet (20 mg total) by mouth daily.  09/06/18   Magrinat, [name], MD  vitamin C (ASCORBIC ACID) 500 MG tablet Take 500 mg by mouth daily.    [provider]    Family History Family History  Problem Relation Age of Onset  . Skin cancer Mother   . Testicular cancer Brother     Social History Social History   Tobacco Use  . Smoking status: Never Smoker  . Smokeless tobacco: Never Used  Substance Use Topics  . Alcohol use: No  . Drug use: No     Allergies   Patient has no known allergies.   Review of Systems Review of Systems  Cardiovascular: Positive for syncope.  Neurological: Negative for focal weakness.  All other systems reviewed and are negative.    Physical Exam Updated Vital Signs BP (!) 150/90   Pulse 73   Temp 98.4 F (36.9 C) (Oral)   Resp 12   Ht 5' 6.5" (1.689 m)   Wt 54.9 kg   SpO2 100%   BMI 19.24 kg/m   Physical Exam Vitals signs and nursing note reviewed.  Constitutional:      Appearance: She is well-developed.  HENT:     Head: Normocephalic.     Comments: Tender occipital scalp    Right Ear: Tympanic membrane normal.     Mouth/Throat:     Mouth: Mucous membranes are moist.  Eyes:     Pupils: Pupils are equal, round, and reactive to light.  Neck:     Musculoskeletal: Normal range of motion.  Cardiovascular:     Rate and Rhythm: Normal rate.     Pulses: Normal pulses.     Comments: Tender right ribs and back  Pulmonary:     Effort: Pulmonary effort is normal.  Abdominal:     General: Abdomen is flat. There is no distension.  Musculoskeletal: Normal range of motion.  Skin:    General: Skin is warm.  Neurological:     Mental Status: She is alert and oriented to person, place, and time.      ED Treatments / Results  Labs (all labs ordered are listed, but only abnormal results are displayed) Labs Reviewed  COMPREHENSIVE METABOLIC PANEL - Abnormal; Notable for the following components:      Result Value   Glucose, Bld 103 (*)    All other components  within normal limits  CBC WITH DIFFERENTIAL/PLATELET    EKG EKG Interpretation  Date/Time:  Monday October 01 2018 10:50:49 EDT Ventricular Rate:  75 PR Interval:  140 QRS Duration: 78 QT Interval:  394 QTC Calculation: 439 R Axis:   -78 Text Interpretation:  Normal sinus rhythm Left axis deviation Abnormal ECG Confirmed by Veryl Speak [phone]) on 10/01/2018 10:57:01 AM   Radiology Dg Ribs Unilateral W/chest Right  Result Date: 10/01/2018 CLINICAL DATA:  Pain following fall EXAM: RIGHT RIBS AND CHEST - 3+ VIEW COMPARISON:  None. FINDINGS: Frontal chest as well as oblique and cone-down rib images obtained. Lungs are hyperexpanded. There is mild scarring in each upper lobe. There is no edema or consolidation. Heart size and pulmonary vascularity are normal. No adenopathy. There are surgical clips in the right axilla. Patient is status post right mastectomy. There is an old healed fracture of the lateral right fourth rib. No acute fracture evident. No blastic or lytic bone lesions. No pleural effusion or pneumothorax. IMPRESSION: Old healed fracture of the lateral right fourth rib. No acute fracture. No blastic or lytic bone lesions. Status post right mastectomy with surgical clips in right axilla. Lungs hyperexpanded with areas of mild scarring. No edema or consolidation. No adenopathy evident. Electronically Signed   By: Lowella Grip III M.D.   On: 10/01/2018 11:17   Ct Head Wo Contrast  Result Date: 10/01/2018 CLINICAL DATA:  Recent syncopal episode EXAM: CT HEAD WITHOUT CONTRAST TECHNIQUE: Contiguous axial images were obtained from the base of the skull through the vertex without intravenous contrast. COMPARISON:  01/12/2016 FINDINGS: Brain: Mild atrophic changes are noted. Small lacunar infarct is noted in the left subinsular cortex stable from the prior exam. No findings to suggest acute hemorrhage, acute infarction or space-occupying mass lesion are noted. Vascular: No hyperdense  vessel or unexpected calcification. Skull: Normal. Negative for fracture or focal lesion. Sinuses/Orbits: No acute finding. Other: None. IMPRESSION: Chronic atrophic and ischemic changes without acute abnormality. Electronically Signed   By: Inez Catalina M.D.   On: 10/01/2018 11:31    Procedures Procedures (including critical care time)  Medications Ordered in ED Medications - No data to display   Initial Impression / Assessment and Plan / ED Course  I have reviewed the triage vital signs and the nursing notes.  Pertinent labs & imaging results that were available during my care of the patient were reviewed by me and considered in my medical decision making (see chart for details).  MDM   Pt orthostatic, labs are normal ct head normal, ribs no fracture.   Pt given orange juice and water.  Pt able to stand and walk.  Pt counseled on drinking plenty of fluids   Final Clinical Impressions(s) / ED Diagnoses   Final diagnoses:  Syncope, unspecified syncope type  Contusion of ribs, right, initial encounter  Contusion of scalp, initial encounter    ED Discharge Orders    None    An After Visit Summary was printed and given to the patient.    Fransico Meadow, [place] 10/01/18 1347    Veryl Speak, MD 10/04/18 [phone]

## 2018-11-14 ENCOUNTER — Telehealth: Payer: Self-pay | Admitting: *Deleted

## 2018-11-14 NOTE — Telephone Encounter (Signed)
Pt left message stating she is having headaches and would like to see Dr Jana Hakim.  This RN returned call to obtain further information - Breanna Nelson states " he always ask me if I have headaches when he sees me so I am now ".  She states she has " tingling in my forehead and I thought maybe it was the way I am sleeping but now it is going on 3 weeks so I thought I should call since he always ask about if I am having headaches "  She denies any nausea, visual changes or specific weakness or deficients in function but she did states " I just been getting weak all over "  She is using tylenol with benefit " but then the headache returns "  This RN discussed above and possible need to be seen by her primary MD- with [name] stating " he would just tell me to come see Dr Jana Hakim ".  This RN asked her if she was concerned if this was cancer with the patient answering - "yes".  This RN informed her likely not cancer - but may be more sinus related. Plan at present is pt is to start an antihistamine such as Claritin or Zyrtec for benefit and an appointment request will be sent to scheduling.  Of note pt had a fall off a stool in late September post donating blood.

## 2018-12-12 ENCOUNTER — Other Ambulatory Visit: Payer: Self-pay

## 2018-12-12 DIAGNOSIS — Z20822 Contact with and (suspected) exposure to covid-19: Secondary | ICD-10-CM

## 2018-12-13 LAB — NOVEL CORONAVIRUS, NAA: SARS-CoV-2, NAA: NOT DETECTED

## 2019-02-19 DIAGNOSIS — R531 Weakness: Secondary | ICD-10-CM | POA: Diagnosis not present

## 2019-02-19 DIAGNOSIS — M810 Age-related osteoporosis without current pathological fracture: Secondary | ICD-10-CM | POA: Diagnosis not present

## 2019-02-27 NOTE — Progress Notes (Signed)
Breanna Nelson  [phone] [phone]     ID: Breanna Nelson DOB: [birth year]  MR#: [id number]  CSN#:[id number]  Patient Care Team: [name], MD as PCP - General (Internal Medicine) Martrice Apt, [name], MD as Consulting Physician (Oncology) [name], MD as Consulting Physician (General Surgery) [name], MD as Consulting Physician (Radiation Oncology) OTHER MD: Dr [name], surgery  CHIEF COMPLAINT: Estrogen receptor positive breast cancer (s/p right mastectomy)  CURRENT TREATMENT:  tamoxifen   INTERVAL HISTORY: "Breanna Nelson" returns today for follow-up of her estrogen receptor positive breast cancer.  She came today for evaluation of fatigue.  She tells me she has been persistently fatigued.  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  She is having to sit down all the time.  This is a new problem for her.  She brought it to Dr. Shelia Media and she tells me he obtained an electrocardiogram and some lab work and everything looked fine.  At his office she was able to walk up stairs without having to stop.  She continues on tamoxifen.  She generally tolerates this with no hot flashes.  She has mild vaginal discharge.  Since her last visit, she passed out in her home on 10/01/2018 after giving blood that day. She fell and hit her right ribs and her head. Fortunately, head CT and right rib x-ray were negative.  She underwent coronavirus testing on 12/12/2018, which was negative.  Her most recent left mammogram was performed at [facility] on about a year ago and repeat is due   REVIEW OF SYSTEMS: Breanna Nelson has a strange headache which she describes variously.  It localizes to the frontal area.  It was very difficult for me to tell if she thought it was on the skull or inside the head.  She could not put one finger on it however.  It comes and goes but it mostly is there all the time and this is of great  concern to her.  She has had no visual changes, no further falls, no problems with balance, no nausea or vomiting.  She also denies tinnitus total duration or loss of appetite in fact her weight is completely stable.  She sleeps well occasionally she takes a nap.  She is having regular bowel movements and no problems with her bladder.  She has had no fevers, cough, shortness of breath, pleurisy, chest pain or pressure.  A detailed review of systems today was otherwise stable.   BREAST CANCER HISTORY: From the original intake note:  The patient herself noted a lump in her right breast late April 20 15th. She brought it to her primary care physician's attention and on 05/06/2013 had a bilateral mammography with tomography at [facility]. The breast density was category C. There was a new area of architectural distortion at the 11:00 position of the right breast with no other significant findings. Accordingly a right breast ultrasound was performed the same day. This identified a 1 cm irregular mass in the right breast at the 10:00 position. This correlated well with the palpable findings and biopsy of the mass was obtained the same day. This showed ([id number]) and invasive mammary carcinoma, with strong diffuse expression of E-cadherin both an invasive and in situ carcinomas, supporting a ductal etiology. Estrogen receptor was 100% positive, progesterone receptor was 96% positive, with strong staining intensity, the MIB-1 was 3%, and there was no HER-2 amplification, the signals ratio being  1.3 and the number per cell 2.5.  The patient underwent bilateral breast MRI 05/13/2013. This confirmed, in the upper outer quadrant of the right breast, several small nodules, associated with an area of postbiopsy hematoma. Taking together the area of enhancement and hematoma the largest measuring was 1.8 cm. There were no masses or abnormal enhancement seen in the left breast and no suspicious lymph nodes.  The patient was  seen in the multidisciplinary clinic by surgery and radiation oncology 05/15/2013. After discussion here and also with Dr. [name], and with a good understanding of the equivalent results between mastectomy and breast conservation surgery plus radiation, the patient opted for mastectomy (without planned reconstruction) and sentinel lymph node sampling. This was performed 06/13/2013 at [facility]. There was some postoperative bleeding requiring evacuation of a hematoma. The final pathology ([id number]) showed a 1.5 cm invasive mammary carcinoma (either at lobular breast cancer with the unusual feature of being E-cadherin positive, or a ductal breast cancer with lobular features), grade 2, with a positive intramammary lymph node. The sentinel lymph node and 4 additional axillary lymph nodes were negative. The tumor was again strongly estrogen and progesterone receptor positive, again HER-2 negative by FISH, with a low MIB-1-1, not read as 10%. Margins were negative.  On 07/03/2013 the patient was reevaluated by radiation oncology at they agreed that postmastectomy radiation was not indicated. They suggested consultation to medical oncology to consider adjuvant hormonal therapy.  The patient's subsequent history is as detailed below.   PAST MEDICAL HISTORY: Past Medical History:  Diagnosis Date  . Breast cancer of upper-outer quadrant of right female breast ([place])   . Osteoporosis     PAST SURGICAL HISTORY: Past Surgical History:  Procedure Laterality Date  . SIMPLE MASTECTOMY W/ SENTINEL NODE BIOPSY Right 06/13/13   by Dr. [name]    FAMILY HISTORY Family History  Problem Relation Age of Onset  . Skin cancer Mother   . Testicular cancer Brother    the patient's father died at the age of 37. The patient's mother died at the age of 105 from heart disease. She also had Alzheimer's disease. The patient had one brother who died in the age of 40 with testicular cancer. One sister died as a  baby. There is no history of breast or ovarian cancer in the family.   GYNECOLOGIC HISTORY:  No LMP recorded. Patient is postmenopausal. Menarche age 60, first live birth age 53. The patient is GX P2. She does not recall when she went through menopause, but he was more than 10 years ago. She never took hormone replacement. She did take birth control pills for a brief period, with no complications   SOCIAL HISTORY:  Breanna Nelson works at [employer]. At home it's just she and her dog [name]. Her husband died from complications of lymphoma. He also had undergone a colectomy in 2004. "He had more than 200 polyps". The patient's daughter [name] lives in [place] where she works as a Engineer, structural. She also does [employer]. Her son Tanasha Menees is a truck driver.  He recently underwent colonoscopy.  He lives near [place], in [place].  The patient has 4 grandchildren. The patient is a [employer].--[name] tells me she is fianced to [name], who is a church member with her.  Unfortunately he has significant COPD problems and is on oxygen.  That is why she was tested for coronavirus earlier this year.    ADVANCED DIRECTIVES: Not in place; the patient intends  to name her daughter [name] as her healthcare power of attorney.   HEALTH MAINTENANCE: Social History   Tobacco Use  . Smoking status: Never Smoker  . Smokeless tobacco: Never Used  Substance Use Topics  . Alcohol use: No  . Drug use: No     Colonoscopy:  PAP:  Bone density:   Lipid panel:  No Known Allergies  Current Outpatient Medications  Medication Sig Dispense Refill  . Calcium 1200-1000 MG-UNIT CHEW Chew 1 capsule by mouth 2 (two) times daily.     . Cholecalciferol (VITAMIN D-3) 1000 UNITS CAPS Take 1 capsule by mouth 2 (two) times daily.     Marland Kitchen FLAXSEED, LINSEED, PO Take by mouth.    . tamoxifen (NOLVADEX) 20 MG tablet Take 1 tablet (20 mg total) by mouth daily. 90 tablet 4  . vitamin C (ASCORBIC ACID) 500 MG  tablet Take 500 mg by mouth daily.     No current facility-administered medications for this visit.    OBJECTIVE: Middle-aged white woman who appears stated age  6:   02/28/19 1542  BP: (!) 140/100  Pulse: 87  Resp: 20  Temp: 99.1 F (37.3 C)  SpO2: 100%     Body mass index is 19.89 kg/m.    ECOG FS:1 - Symptomatic but completely ambulatory  Sclerae unicteric, EOMs intact Wearing a mask No cervical or supraclavicular adenopathy Lungs no rales or rhonchi Heart regular rate and rhythm Abd soft, nontender, positive bowel sounds MSK no focal spinal tenderness, no upper extremity lymphedema Neuro: nonfocal, well oriented, affect appears mildly anxious but not depressed Breasts: The right breast is status post mastectomy.  There is no evidence of chest wall recurrence.  Left breast is benign.  Both axillae are benign.   LAB RESULTS:  CMP     Component Value Date/Time   NA 144 02/28/2019 1520   NA 139 09/12/2016 1235   K 4.4 02/28/2019 1520   K 3.5 09/12/2016 1235   CL 104 02/28/2019 1520   CO2 32 02/28/2019 1520   CO2 28 09/12/2016 1235   GLUCOSE 102 (H) 02/28/2019 1520   GLUCOSE 118 09/12/2016 1235   BUN 15 02/28/2019 1520   BUN 11.9 09/12/2016 1235   CREATININE 0.94 02/28/2019 1520   CREATININE 0.73 09/06/2018 1224   CREATININE 0.8 09/12/2016 1235   CALCIUM 8.6 (L) 02/28/2019 1520   CALCIUM 8.9 09/12/2016 1235   PROT 6.3 (L) 02/28/2019 1520   PROT 6.4 09/12/2016 1235   ALBUMIN 3.7 02/28/2019 1520   ALBUMIN 3.6 09/12/2016 1235   AST 25 02/28/2019 1520   AST 30 09/06/2018 1224   AST 25 09/12/2016 1235   ALT 19 02/28/2019 1520   ALT 18 09/06/2018 1224   ALT 15 09/12/2016 1235   ALKPHOS 52 02/28/2019 1520   ALKPHOS 53 09/12/2016 1235   BILITOT 0.5 02/28/2019 1520   BILITOT 0.6 09/06/2018 1224   BILITOT 0.32 09/12/2016 1235   GFRNONAA >60 02/28/2019 1520   GFRNONAA >60 09/06/2018 1224   GFRAA >60 02/28/2019 1520   GFRAA >60 09/06/2018 1224    No  results found for: TOTALPROTELP, ALBUMINELP, A1GS, A2GS, BETS, BETA2SER, GAMS, MSPIKE, SPEI  Lab Results  Component Value Date   WBC 6.2 02/28/2019   NEUTROABS 3.3 02/28/2019   HGB 14.2 02/28/2019   HCT 42.3 02/28/2019   MCV 95.5 02/28/2019   PLT 190 02/28/2019    No results found for: LABCA2  No components found for: FXTKWI097  No results for input(s):  INR in the last 168 hours.  No results found for: LABCA2  No results found for: UXL244  No results found for: WNU272  No results found for: ZDG644  No results found for: CA2729  No components found for: HGQUANT  No results found for: CEA1 / No results found for: CEA1   No results found for: AFPTUMOR  No results found for: CHROMOGRNA  No results found for: KPAFRELGTCHN, LAMBDASER, KAPLAMBRATIO (kappa/lambda light chains)  No results found for: HGBA, HGBA2QUANT, HGBFQUANT, HGBSQUAN (Hemoglobinopathy evaluation)   No results found for: LDH  No results found for: IRON, TIBC, IRONPCTSAT (Iron and TIBC)  Lab Results  Component Value Date   FERRITIN 19 09/07/2017    Urinalysis No results found for: COLORURINE, APPEARANCEUR, LABSPEC, PHURINE, GLUCOSEU, HGBUR, BILIRUBINUR, KETONESUR, PROTEINUR, UROBILINOGEN, NITRITE, LEUKOCYTESUR   STUDIES: No results found.   ASSESSMENT: 69 y.o. [place] woman status post right mastectomy and sentinel lymph node sampling 06/13/2013 for an upper outer quadrant pT2 pN1, stage IIB invasive mammary carcinoma (either an invasive ductal carcinoma with lobular features, or a lobular carcinoma that was E-cadherin strongly positive), estrogen and progesterone receptor positive, HER-2 nonamplified, with a low MIB-1  (1) there was no indication for postmastectomy radiation  (2) the patient opted against adjuvant chemotherapy  (3) tamoxifen started September 2015.  (4) osteoporosis by bone density scan 09/18/2009   PLAN: Breanna Nelson is now 5-1/2 years out from definitive surgery for  breast cancer with no evidence of disease recurrence.  This is very favorable.  She continues to tolerate tamoxifen well and she has completed more than 5 years.  In fact we were planning to "graduate" this visit, but with this new symptom of unexplained fatigue we will need to do some work-up.  She is very concerned about the headache problem and I am going to get a head CT with and without contrast.  However her neuro exam is unremarkable.   We will check some endocrine lab work next week.  I will call her with those results.  I have also set her up for repeat mammography within the next few weeks.  She knows to call for any other issue that may develop before the next visit.  Total encounter time 35 minutes.*   Gabrian Hoque, [name], MD  02/28/19 4:15 PM Medical Oncology and Hematology [facility] [place], [place] [address] Tel. [phone]    Fax. [phone]   I, Wilburn Mylar, am acting as scribe for Dr. [name]. Kalliope Riesen.  I, Lurline Del MD, have reviewed the above documentation for accuracy and completeness, and I agree with the above.   *Total Encounter Time as defined by the Centers for Medicare and Medicaid Services includes, in addition to the face-to-face time of a patient visit (documented in the note above) non-face-to-face time: obtaining and reviewing outside history, ordering and reviewing medications, tests or procedures, care coordination (communications with other health care professionals or caregivers) and documentation in the medical record.

## 2019-02-28 ENCOUNTER — Other Ambulatory Visit: Payer: Self-pay

## 2019-02-28 ENCOUNTER — Inpatient Hospital Stay: Payer: Medicare PPO | Attending: Oncology | Admitting: Oncology

## 2019-02-28 ENCOUNTER — Inpatient Hospital Stay: Payer: Medicare PPO

## 2019-02-28 VITALS — BP 140/100 | HR 87 | Temp 99.1°F | Resp 20 | Ht 66.5 in | Wt 125.1 lb

## 2019-02-28 DIAGNOSIS — Z170 Estrogen receptor positive status [ER+]: Secondary | ICD-10-CM | POA: Diagnosis not present

## 2019-02-28 DIAGNOSIS — J449 Chronic obstructive pulmonary disease, unspecified: Secondary | ICD-10-CM | POA: Insufficient documentation

## 2019-02-28 DIAGNOSIS — Z79810 Long term (current) use of selective estrogen receptor modulators (SERMs): Secondary | ICD-10-CM | POA: Diagnosis not present

## 2019-02-28 DIAGNOSIS — Z9011 Acquired absence of right breast and nipple: Secondary | ICD-10-CM | POA: Diagnosis not present

## 2019-02-28 DIAGNOSIS — Z9981 Dependence on supplemental oxygen: Secondary | ICD-10-CM | POA: Insufficient documentation

## 2019-02-28 DIAGNOSIS — G4489 Other headache syndrome: Secondary | ICD-10-CM

## 2019-02-28 DIAGNOSIS — C50411 Malignant neoplasm of upper-outer quadrant of right female breast: Secondary | ICD-10-CM | POA: Diagnosis not present

## 2019-02-28 DIAGNOSIS — M818 Other osteoporosis without current pathological fracture: Secondary | ICD-10-CM | POA: Insufficient documentation

## 2019-02-28 LAB — CBC WITH DIFFERENTIAL/PLATELET
Abs Immature Granulocytes: 0.01 10*3/uL (ref 0.00–0.07)
Basophils Absolute: 0.1 10*3/uL (ref 0.0–0.1)
Basophils Relative: 1 %
Eosinophils Absolute: 0.1 10*3/uL (ref 0.0–0.5)
Eosinophils Relative: 2 %
HCT: 42.3 % (ref 36.0–46.0)
Hemoglobin: 14.2 g/dL (ref 12.0–15.0)
Immature Granulocytes: 0 %
Lymphocytes Relative: 31 %
Lymphs Abs: 1.9 10*3/uL (ref 0.7–4.0)
MCH: 32.1 pg (ref 26.0–34.0)
MCHC: 33.6 g/dL (ref 30.0–36.0)
MCV: 95.5 fL (ref 80.0–100.0)
Monocytes Absolute: 0.8 10*3/uL (ref 0.1–1.0)
Monocytes Relative: 13 %
Neutro Abs: 3.3 10*3/uL (ref 1.7–7.7)
Neutrophils Relative %: 53 %
Platelets: 190 10*3/uL (ref 150–400)
RBC: 4.43 MIL/uL (ref 3.87–5.11)
RDW: 12.9 % (ref 11.5–15.5)
WBC: 6.2 10*3/uL (ref 4.0–10.5)
nRBC: 0 % (ref 0.0–0.2)

## 2019-02-28 LAB — COMPREHENSIVE METABOLIC PANEL
ALT: 19 U/L (ref 0–44)
AST: 25 U/L (ref 15–41)
Albumin: 3.7 g/dL (ref 3.5–5.0)
Alkaline Phosphatase: 52 U/L (ref 38–126)
Anion gap: 8 (ref 5–15)
BUN: 15 mg/dL (ref 8–23)
CO2: 32 mmol/L (ref 22–32)
Calcium: 8.6 mg/dL — ABNORMAL LOW (ref 8.9–10.3)
Chloride: 104 mmol/L (ref 98–111)
Creatinine, Ser: 0.94 mg/dL (ref 0.44–1.00)
GFR calc Af Amer: >60 mL/min (ref >60)
GFR calc non Af Amer: >60 mL/min (ref >60)
Glucose, Bld: 102 mg/dL — ABNORMAL HIGH (ref 70–99)
Potassium: 4.4 mmol/L (ref 3.5–5.1)
Sodium: 144 mmol/L (ref 135–145)
Total Bilirubin: 0.5 mg/dL (ref 0.3–1.2)
Total Protein: 6.3 g/dL — ABNORMAL LOW (ref 6.5–8.1)

## 2019-02-28 MED ORDER — TAMOXIFEN CITRATE 20 MG PO TABS: 20.0000 mg | ORAL_TABLET | Freq: Every day | ORAL | 4 refills | Status: DC

## 2019-03-07 ENCOUNTER — Other Ambulatory Visit: Payer: Medicare PPO

## 2019-03-07 ENCOUNTER — Other Ambulatory Visit: Payer: Self-pay | Admitting: Oncology

## 2019-03-07 DIAGNOSIS — Z170 Estrogen receptor positive status [ER+]: Secondary | ICD-10-CM

## 2019-03-07 DIAGNOSIS — C50411 Malignant neoplasm of upper-outer quadrant of right female breast: Secondary | ICD-10-CM

## 2019-03-12 ENCOUNTER — Other Ambulatory Visit: Payer: Medicare PPO

## 2019-03-14 ENCOUNTER — Other Ambulatory Visit: Payer: Self-pay | Admitting: *Deleted

## 2019-03-14 DIAGNOSIS — Z170 Estrogen receptor positive status [ER+]: Secondary | ICD-10-CM

## 2019-03-14 DIAGNOSIS — C50411 Malignant neoplasm of upper-outer quadrant of right female breast: Secondary | ICD-10-CM

## 2019-03-14 DIAGNOSIS — I10 Essential (primary) hypertension: Secondary | ICD-10-CM | POA: Diagnosis not present

## 2019-03-15 ENCOUNTER — Encounter (HOSPITAL_COMMUNITY): Payer: Self-pay

## 2019-03-15 ENCOUNTER — Other Ambulatory Visit: Payer: Self-pay

## 2019-03-15 ENCOUNTER — Other Ambulatory Visit: Payer: Self-pay | Admitting: Oncology

## 2019-03-15 ENCOUNTER — Encounter: Payer: Self-pay | Admitting: Oncology

## 2019-03-15 ENCOUNTER — Inpatient Hospital Stay: Payer: Medicare PPO | Attending: Oncology

## 2019-03-15 ENCOUNTER — Ambulatory Visit (HOSPITAL_COMMUNITY)
Admission: RE | Admit: 2019-03-15 | Discharge: 2019-03-15 | Disposition: A | Discharge status: Home / Self Care | Payer: Medicare PPO | Source: Ambulatory Visit | Attending: Oncology | Admitting: Oncology

## 2019-03-15 DIAGNOSIS — Z170 Estrogen receptor positive status [ER+]: Secondary | ICD-10-CM | POA: Diagnosis not present

## 2019-03-15 DIAGNOSIS — M818 Other osteoporosis without current pathological fracture: Secondary | ICD-10-CM | POA: Diagnosis not present

## 2019-03-15 DIAGNOSIS — R5383 Other fatigue: Secondary | ICD-10-CM | POA: Insufficient documentation

## 2019-03-15 DIAGNOSIS — C50411 Malignant neoplasm of upper-outer quadrant of right female breast: Secondary | ICD-10-CM | POA: Diagnosis not present

## 2019-03-15 DIAGNOSIS — Z79810 Long term (current) use of selective estrogen receptor modulators (SERMs): Secondary | ICD-10-CM | POA: Diagnosis not present

## 2019-03-15 DIAGNOSIS — G4489 Other headache syndrome: Secondary | ICD-10-CM | POA: Insufficient documentation

## 2019-03-15 DIAGNOSIS — R519 Headache, unspecified: Secondary | ICD-10-CM | POA: Diagnosis not present

## 2019-03-15 LAB — CMP (CANCER CENTER ONLY)
ALT: 21 U/L (ref 0–44)
AST: 29 U/L (ref 15–41)
Albumin: 4 g/dL (ref 3.5–5.0)
Alkaline Phosphatase: 47 U/L (ref 38–126)
Anion gap: 9 (ref 5–15)
BUN: 13 mg/dL (ref 8–23)
CO2: 29 mmol/L (ref 22–32)
Calcium: 9.1 mg/dL (ref 8.9–10.3)
Chloride: 100 mmol/L (ref 98–111)
Creatinine: 0.81 mg/dL (ref 0.44–1.00)
GFR, Est AFR Am: >60 mL/min (ref >60)
GFR, Estimated: >60 mL/min (ref >60)
Glucose, Bld: 104 mg/dL — ABNORMAL HIGH (ref 70–99)
Potassium: 4.4 mmol/L (ref 3.5–5.1)
Sodium: 138 mmol/L (ref 135–145)
Total Bilirubin: 0.8 mg/dL (ref 0.3–1.2)
Total Protein: 6.7 g/dL (ref 6.5–8.1)

## 2019-03-15 LAB — HEPATITIS C ANTIBODY: HCV Ab: NONREACTIVE

## 2019-03-15 LAB — SEDIMENTATION RATE: Sed Rate: 0 mm/hr (ref 0–22)

## 2019-03-15 LAB — HEPATITIS B SURFACE ANTIGEN: Hepatitis B Surface Ag: NONREACTIVE

## 2019-03-15 LAB — CORTISOL: Cortisol, Plasma: 13.2 ug/dL

## 2019-03-15 LAB — HEPATITIS B CORE ANTIBODY, TOTAL: Hep B Core Total Ab: NONREACTIVE

## 2019-03-15 LAB — HIV ANTIBODY (ROUTINE TESTING W REFLEX): HIV Screen 4th Generation wRfx: NONREACTIVE

## 2019-03-15 LAB — C-REACTIVE PROTEIN: CRP: 0.5 mg/dL (ref <1.0)

## 2019-03-15 MED ORDER — SODIUM CHLORIDE (PF) 0.9 % IJ SOLN
INTRAMUSCULAR | Status: AC
  Filled 2019-03-15: qty 50

## 2019-03-15 MED ORDER — IOHEXOL 300 MG/ML  SOLN
75.0000 mL | Freq: Once | INTRAMUSCULAR | Status: AC | PRN
  Administered 2019-03-15: 75 mL via INTRAVENOUS

## 2019-03-16 LAB — THYROID PANEL WITH TSH
Free Thyroxine Index: 1.9 (ref 1.2–4.9)
T3 Uptake Ratio: 22 % — ABNORMAL LOW (ref 24–39)
T4, Total: 8.5 ug/dL (ref 4.5–12.0)
TSH: 1.18 u[IU]/mL (ref 0.450–4.500)

## 2019-04-16 DIAGNOSIS — D1801 Hemangioma of skin and subcutaneous tissue: Secondary | ICD-10-CM | POA: Diagnosis not present

## 2019-04-16 DIAGNOSIS — D225 Melanocytic nevi of trunk: Secondary | ICD-10-CM | POA: Diagnosis not present

## 2019-04-16 DIAGNOSIS — L814 Other melanin hyperpigmentation: Secondary | ICD-10-CM | POA: Diagnosis not present

## 2019-04-25 DIAGNOSIS — I10 Essential (primary) hypertension: Secondary | ICD-10-CM | POA: Diagnosis not present

## 2019-05-02 DIAGNOSIS — Z803 Family history of malignant neoplasm of breast: Secondary | ICD-10-CM | POA: Diagnosis not present

## 2019-05-02 DIAGNOSIS — Z8043 Family history of malignant neoplasm of testis: Secondary | ICD-10-CM | POA: Diagnosis not present

## 2019-05-02 DIAGNOSIS — C50911 Malignant neoplasm of unspecified site of right female breast: Secondary | ICD-10-CM | POA: Diagnosis not present

## 2019-05-21 DIAGNOSIS — N390 Urinary tract infection, site not specified: Secondary | ICD-10-CM | POA: Diagnosis not present

## 2019-05-21 DIAGNOSIS — I10 Essential (primary) hypertension: Secondary | ICD-10-CM | POA: Diagnosis not present

## 2019-05-24 DIAGNOSIS — M810 Age-related osteoporosis without current pathological fracture: Secondary | ICD-10-CM | POA: Diagnosis not present

## 2019-05-24 DIAGNOSIS — B880 Other acariasis: Secondary | ICD-10-CM | POA: Diagnosis not present

## 2019-05-24 DIAGNOSIS — Z52008 Unspecified donor, other blood: Secondary | ICD-10-CM | POA: Diagnosis not present

## 2019-05-24 DIAGNOSIS — Z0000 Encounter for general adult medical examination without abnormal findings: Secondary | ICD-10-CM | POA: Diagnosis not present

## 2019-06-04 DIAGNOSIS — Z1212 Encounter for screening for malignant neoplasm of rectum: Secondary | ICD-10-CM | POA: Diagnosis not present

## 2019-06-04 DIAGNOSIS — Z1211 Encounter for screening for malignant neoplasm of colon: Secondary | ICD-10-CM | POA: Diagnosis not present

## 2019-06-07 LAB — EXTERNAL GENERIC LAB PROCEDURE: COLOGUARD: NEGATIVE

## 2019-08-09 DIAGNOSIS — Z1231 Encounter for screening mammogram for malignant neoplasm of breast: Secondary | ICD-10-CM | POA: Diagnosis not present

## 2019-08-21 ENCOUNTER — Telehealth: Payer: Self-pay | Admitting: Oncology

## 2019-08-21 NOTE — Telephone Encounter (Signed)
Rescheduled appts per provider PAL day. Pt confirmed new appt date and time.

## 2019-09-10 ENCOUNTER — Other Ambulatory Visit: Payer: Medicare PPO

## 2019-09-10 ENCOUNTER — Ambulatory Visit: Payer: Medicare PPO | Admitting: Oncology

## 2019-09-11 ENCOUNTER — Telehealth: Payer: Self-pay | Admitting: Adult Health

## 2019-09-12 ENCOUNTER — Ambulatory Visit: Payer: Medicare PPO | Admitting: Oncology

## 2019-09-12 ENCOUNTER — Other Ambulatory Visit: Payer: Medicare PPO

## 2019-09-19 ENCOUNTER — Other Ambulatory Visit: Payer: Medicare PPO

## 2019-09-19 ENCOUNTER — Ambulatory Visit: Payer: Medicare PPO | Admitting: Adult Health

## 2019-09-26 ENCOUNTER — Other Ambulatory Visit: Payer: Self-pay

## 2019-09-26 DIAGNOSIS — C50411 Malignant neoplasm of upper-outer quadrant of right female breast: Secondary | ICD-10-CM

## 2019-09-26 DIAGNOSIS — Z170 Estrogen receptor positive status [ER+]: Secondary | ICD-10-CM

## 2019-09-27 ENCOUNTER — Other Ambulatory Visit: Payer: Self-pay

## 2019-09-27 ENCOUNTER — Inpatient Hospital Stay: Payer: Medicare PPO | Attending: Adult Health

## 2019-09-27 ENCOUNTER — Inpatient Hospital Stay: Payer: Medicare PPO | Admitting: Adult Health

## 2019-09-27 ENCOUNTER — Encounter: Payer: Self-pay | Admitting: Adult Health

## 2019-09-27 VITALS — BP 154/84 | HR 66 | Temp 97.7°F | Resp 17 | Ht 66.5 in | Wt 119.9 lb

## 2019-09-27 DIAGNOSIS — Z79899 Other long term (current) drug therapy: Secondary | ICD-10-CM | POA: Insufficient documentation

## 2019-09-27 DIAGNOSIS — Z9981 Dependence on supplemental oxygen: Secondary | ICD-10-CM | POA: Insufficient documentation

## 2019-09-27 DIAGNOSIS — M818 Other osteoporosis without current pathological fracture: Secondary | ICD-10-CM | POA: Insufficient documentation

## 2019-09-27 DIAGNOSIS — Z170 Estrogen receptor positive status [ER+]: Secondary | ICD-10-CM

## 2019-09-27 DIAGNOSIS — Z79810 Long term (current) use of selective estrogen receptor modulators (SERMs): Secondary | ICD-10-CM | POA: Diagnosis not present

## 2019-09-27 DIAGNOSIS — C50411 Malignant neoplasm of upper-outer quadrant of right female breast: Secondary | ICD-10-CM | POA: Diagnosis not present

## 2019-09-27 DIAGNOSIS — J449 Chronic obstructive pulmonary disease, unspecified: Secondary | ICD-10-CM | POA: Insufficient documentation

## 2019-09-27 DIAGNOSIS — C50911 Malignant neoplasm of unspecified site of right female breast: Secondary | ICD-10-CM | POA: Insufficient documentation

## 2019-09-27 LAB — CMP (CANCER CENTER ONLY)
ALT: 22 U/L (ref 0–44)
AST: 32 U/L (ref 15–41)
Albumin: 3.6 g/dL (ref 3.5–5.0)
Alkaline Phosphatase: 49 U/L (ref 38–126)
Anion gap: 5 (ref 5–15)
BUN: 12 mg/dL (ref 8–23)
CO2: 30 mmol/L (ref 22–32)
Calcium: 8.7 mg/dL — ABNORMAL LOW (ref 8.9–10.3)
Chloride: 103 mmol/L (ref 98–111)
Creatinine: 0.74 mg/dL (ref 0.44–1.00)
GFR, Est AFR Am: >60 mL/min (ref >60)
GFR, Estimated: >60 mL/min (ref >60)
Glucose, Bld: 96 mg/dL (ref 70–99)
Potassium: 4.1 mmol/L (ref 3.5–5.1)
Sodium: 138 mmol/L (ref 135–145)
Total Bilirubin: 0.5 mg/dL (ref 0.3–1.2)
Total Protein: 6.3 g/dL — ABNORMAL LOW (ref 6.5–8.1)

## 2019-09-27 LAB — CBC WITH DIFFERENTIAL (CANCER CENTER ONLY)
Abs Immature Granulocytes: 0.01 10*3/uL (ref 0.00–0.07)
Basophils Absolute: 0.1 10*3/uL (ref 0.0–0.1)
Basophils Relative: 1 %
Eosinophils Absolute: 0.1 10*3/uL (ref 0.0–0.5)
Eosinophils Relative: 1 %
HCT: 40.7 % (ref 36.0–46.0)
Hemoglobin: 13.7 g/dL (ref 12.0–15.0)
Immature Granulocytes: 0 %
Lymphocytes Relative: 26 %
Lymphs Abs: 1.8 10*3/uL (ref 0.7–4.0)
MCH: 31.9 pg (ref 26.0–34.0)
MCHC: 33.7 g/dL (ref 30.0–36.0)
MCV: 94.9 fL (ref 80.0–100.0)
Monocytes Absolute: 0.7 10*3/uL (ref 0.1–1.0)
Monocytes Relative: 10 %
Neutro Abs: 4.2 10*3/uL (ref 1.7–7.7)
Neutrophils Relative %: 62 %
Platelet Count: 193 10*3/uL (ref 150–400)
RBC: 4.29 MIL/uL (ref 3.87–5.11)
RDW: 13.1 % (ref 11.5–15.5)
WBC Count: 6.7 10*3/uL (ref 4.0–10.5)
nRBC: 0 % (ref 0.0–0.2)

## 2019-09-27 NOTE — Progress Notes (Signed)
[place]  [phone] Fax:([phone]) [phone]     ID: Breanna Nelson DOB: [birth year]  MR#: [id number]  CSN#:[id number]  Patient Care Team: Breanna Pretty, MD as PCP - General (Internal Medicine) Nelson, Breanna Dad, MD as Consulting Physician (Oncology) Breanna Luna, MD as Consulting Physician (General Surgery) Breanna Pray, MD as Consulting Physician (Radiation Oncology) OTHER MD: Breanna Nelson, surgery  CHIEF COMPLAINT: Estrogen receptor positive breast cancer (s/p right mastectomy)  CURRENT TREATMENT:  tamoxifen   INTERVAL HISTORY: "Breanna Nelson" returns today for follow-up of her estrogen receptor positive breast cancer.  She continues taking tamoxifen daily and tolerates that well.   She underwent her mammogram in July of this year at [facility] that was normal.  She is feeling well and has no concerns.    REVIEW OF SYSTEMS: Breanna Nelson sees her PCP Breanna. Shelia Nelson regularly.  She is up to date with skin and colon cancer screening.  For exercise she rides her stationary bike.  In fact she rode 7 miles this morning.  She has no new health concerns, and a detailed ROS was otherwise non contributory.     BREAST CANCER HISTORY: From the original intake note:  The patient herself noted a lump in her right breast late April 20 15th. She brought it to her primary care physician's attention and on 05/06/2013 had a bilateral mammography with tomography at [facility]. The breast density was category C. There was a new area of architectural distortion at the 11:00 position of the right breast with no other significant findings. Accordingly a right breast ultrasound was performed the same day. This identified a 1 cm irregular mass in the right breast at the 10:00 position. This correlated well with the palpable findings and biopsy of the mass was obtained the same day. This showed (SAA [id number]) and invasive mammary carcinoma, with strong diffuse expression of E-cadherin both an invasive and  in situ carcinomas, supporting a ductal etiology. Estrogen receptor was 100% positive, progesterone receptor was 96% positive, with strong staining intensity, the MIB-1 was 3%, and there was no HER-2 amplification, the signals ratio being 1.3 and the number per cell 2.5.  The patient underwent bilateral breast MRI 05/13/2013. This confirmed, in the upper outer quadrant of the right breast, several small nodules, associated with an area of postbiopsy hematoma. Taking together the area of enhancement and hematoma the largest measuring was 1.8 cm. There were no masses or abnormal enhancement seen in the left breast and no suspicious lymph nodes.  The patient was seen in the multidisciplinary clinic by surgery and radiation oncology 05/15/2013. After discussion here and also with Breanna. Nicole Nelson, and with a good understanding of the equivalent results between mastectomy and breast conservation surgery plus radiation, the patient opted for mastectomy (without planned reconstruction) and sentinel lymph node sampling. This was performed 06/13/2013 at [facility]. There was some postoperative bleeding requiring evacuation of a hematoma. The final pathology ([id number]) showed a 1.5 cm invasive mammary carcinoma (either at lobular breast cancer with the unusual feature of being E-cadherin positive, or a ductal breast cancer with lobular features), grade 2, with a positive intramammary lymph node. The sentinel lymph node and 4 additional axillary lymph nodes were negative. The tumor was again strongly estrogen and progesterone receptor positive, again HER-2 negative by FISH, with a low MIB-1-1, not read as 10%. Margins were negative.  On 07/03/2013 the patient was reevaluated by radiation oncology at they agreed that postmastectomy radiation was not indicated. They suggested consultation to  medical oncology to consider adjuvant hormonal therapy.  The patient's subsequent history is as detailed below.   PAST  MEDICAL HISTORY: Past Medical History:  Diagnosis Date  . Breast cancer of upper-outer quadrant of right female breast ([place]) dx'd 04/2013  . Osteoporosis     PAST SURGICAL HISTORY: Past Surgical History:  Procedure Laterality Date  . SIMPLE MASTECTOMY W/ SENTINEL NODE BIOPSY Right 06/13/13   by Breanna. Theodoro Nelson    FAMILY HISTORY Family History  Problem Relation Age of Onset  . Skin cancer Mother   . Testicular cancer Brother    the patient's father died at the age of 65. The patient's mother died at the age of 46 from heart disease. She also had Alzheimer's disease. The patient had one brother who died in the age of 87 with testicular cancer. One sister died as a baby. There is no history of breast or ovarian cancer in the family.   GYNECOLOGIC HISTORY:  No LMP recorded. Patient is postmenopausal. Menarche age 15, first live birth age 72. The patient is GX P2. She does not recall when she went through menopause, but he was more than 10 years ago. She never took hormone replacement. She did take birth control pills for a brief period, with no complications   SOCIAL HISTORY:  Breanna Nelson works at [employer]. At home it's just she and her dog [name]. Her husband died from complications of lymphoma. He also had undergone a colectomy in 2004. "He had more than 200 polyps". The patient's daughter Breanna Nelson lives in [place] where she works as a Engineer, structural. She also does [employer]. Her son Breanna Nelson is a truck driver.  He recently underwent colonoscopy.  He lives near [place], in [place].  The patient has 4 grandchildren. The patient is a [employer].--[name] tells me she is fianced to Breanna Nelson, who is a church member with her.  Unfortunately he has significant COPD problems and is on oxygen.  That is why she was tested for coronavirus earlier this year.    ADVANCED DIRECTIVES: Not in place; the patient intends to name her daughter Breanna Nelson as her healthcare power of  attorney.   HEALTH MAINTENANCE: Social History   Tobacco Use  . Smoking status: Never Smoker  . Smokeless tobacco: Never Used  Substance Use Topics  . Alcohol use: No  . Drug use: No     Colonoscopy:  PAP:  Bone density:   Lipid panel:  No Known Allergies  Current Outpatient Medications  Medication Sig Dispense Refill  . amLODipine (NORVASC) 5 MG tablet     . Calcium 1200-1000 MG-UNIT CHEW Chew 1 capsule by mouth 2 (two) times daily.     . Cholecalciferol (VITAMIN D-3) 1000 UNITS CAPS Take 1 capsule by mouth 2 (two) times daily.     . tamoxifen (NOLVADEX) 20 MG tablet TAKE 1 TABLET EVERY DAY 90 tablet 4  . vitamin C (ASCORBIC ACID) 500 MG tablet Take 500 mg by mouth daily.     No current facility-administered medications for this visit.    OBJECTIVE:   Vitals:   09/27/19 1349  BP: (!) 154/84  Pulse: 66  Resp: 17  Temp: 97.7 F (36.5 C)  SpO2: 100%     Body mass index is 19.06 kg/m.    ECOG FS:1 - Symptomatic but completely ambulatory GENERAL: Patient is a well appearing female in no acute distress HEENT:  Sclerae anicteric.  Mask in place. Neck is supple.  NODES:  No cervical, supraclavicular, or axillary lymphadenopathy palpated.  BREAST EXAM:  Deferred. LUNGS:  Clear to auscultation bilaterally.  No wheezes or rhonchi. HEART:  Regular rate and rhythm. No murmur appreciated. ABDOMEN:  Soft, nontender.  Positive, normoactive bowel sounds. No organomegaly palpated. MSK:  No focal spinal tenderness to palpation. Full range of motion bilaterally in the upper extremities. EXTREMITIES:  No peripheral edema.   SKIN:  Clear with no obvious rashes or skin changes. No nail dyscrasia. NEURO:  Nonfocal. Well oriented.  Appropriate affect.    LAB RESULTS:  CMP     Component Value Date/Time   NA 138 09/27/2019 1330   NA 139 09/12/2016 1235   K 4.1 09/27/2019 1330   K 3.5 09/12/2016 1235   CL 103 09/27/2019 1330   CO2 30 09/27/2019 1330   CO2 28 09/12/2016 1235    GLUCOSE 96 09/27/2019 1330   GLUCOSE 118 09/12/2016 1235   BUN 12 09/27/2019 1330   BUN 11.9 09/12/2016 1235   CREATININE 0.74 09/27/2019 1330   CREATININE 0.8 09/12/2016 1235   CALCIUM 8.7 (L) 09/27/2019 1330   CALCIUM 8.9 09/12/2016 1235   PROT 6.3 (L) 09/27/2019 1330   PROT 6.4 09/12/2016 1235   ALBUMIN 3.6 09/27/2019 1330   ALBUMIN 3.6 09/12/2016 1235   AST 32 09/27/2019 1330   AST 25 09/12/2016 1235   ALT 22 09/27/2019 1330   ALT 15 09/12/2016 1235   ALKPHOS 49 09/27/2019 1330   ALKPHOS 53 09/12/2016 1235   BILITOT 0.5 09/27/2019 1330   BILITOT 0.32 09/12/2016 1235   GFRNONAA >60 09/27/2019 1330   GFRAA >60 09/27/2019 1330    No results found for: TOTALPROTELP, ALBUMINELP, A1GS, A2GS, BETS, BETA2SER, GAMS, MSPIKE, SPEI  Lab Results  Component Value Date   WBC 6.7 09/27/2019   NEUTROABS 4.2 09/27/2019   HGB 13.7 09/27/2019   HCT 40.7 09/27/2019   MCV 94.9 09/27/2019   PLT 193 09/27/2019    No results found for: LABCA2  No components found for: LGXQJJ941  No results for input(s): INR in the last 168 hours.  No results found for: LABCA2  No results found for: DEY814  No results found for: GYJ856  No results found for: DJS970  No results found for: CA2729  No components found for: HGQUANT  No results found for: CEA1 / No results found for: CEA1   No results found for: AFPTUMOR  No results found for: CHROMOGRNA  No results found for: KPAFRELGTCHN, LAMBDASER, KAPLAMBRATIO (kappa/lambda light chains)  No results found for: HGBA, HGBA2QUANT, HGBFQUANT, HGBSQUAN (Hemoglobinopathy evaluation)   No results found for: LDH  No results found for: IRON, TIBC, IRONPCTSAT (Iron and TIBC)  Lab Results  Component Value Date   FERRITIN 19 09/07/2017    Urinalysis No results found for: COLORURINE, APPEARANCEUR, LABSPEC, PHURINE, GLUCOSEU, HGBUR, BILIRUBINUR, KETONESUR, PROTEINUR, UROBILINOGEN, NITRITE, LEUKOCYTESUR   STUDIES: No results  found.   ASSESSMENT: 69 y.o. [place] woman status post right mastectomy and sentinel lymph node sampling 06/13/2013 for an upper outer quadrant pT2 pN1, stage IIB invasive mammary carcinoma (either an invasive ductal carcinoma with lobular features, or a lobular carcinoma that was E-cadherin strongly positive), estrogen and progesterone receptor positive, HER-2 nonamplified, with a low MIB-1  (1) there was no indication for postmastectomy radiation  (2) the patient opted against adjuvant chemotherapy  (3) tamoxifen started September 2015.  (4) osteoporosis by bone density scan 09/18/2009   PLAN: Breanna Nelson is here today for f/u of her estrogen positive stage  IIB breast cancer.  She has no clinical or radiographic sign of breast cancer recurrence.  She will continue on Tamoxifen daily.  She will be due for repeat mammogram in 07/2020.    I recommended she continue with healthy diet and exercise.  She agrees.  Her labs are normal today and I reviewed them with her in detail.  She will return in 02/2020 for labs and f/u with Breanna. Jana Hakim.  She knows to call for any questions that may arise between now and her next appointment.  We are happy to see her sooner if needed.   Total encounter time 20 minutes.Wilber Bihari, NP 09/27/19 2:15 PM Medical Oncology and Hematology [facility] [place], [place] [address] Tel. [phone]    Fax. [phone]   *Total Encounter Time as defined by the Centers for Medicare and Medicaid Services includes, in addition to the face-to-face time of a patient visit (documented in the note above) non-face-to-face time: obtaining and reviewing outside history, ordering and reviewing medications, tests or procedures, care coordination (communications with other health care professionals or caregivers) and documentation in the medical record.

## 2019-09-30 ENCOUNTER — Other Ambulatory Visit: Payer: Self-pay | Admitting: *Deleted

## 2019-09-30 DIAGNOSIS — C50411 Malignant neoplasm of upper-outer quadrant of right female breast: Secondary | ICD-10-CM

## 2019-09-30 DIAGNOSIS — Z170 Estrogen receptor positive status [ER+]: Secondary | ICD-10-CM

## 2019-12-31 ENCOUNTER — Telehealth: Payer: Self-pay | Admitting: Oncology

## 2019-12-31 NOTE — Telephone Encounter (Signed)
Patient called to reschedule appointment earlier. No availability that fit patient's schedule. Patient okay to keep appointment as is.

## 2020-01-06 ENCOUNTER — Telehealth: Payer: Self-pay | Admitting: Medical

## 2020-01-06 NOTE — Telephone Encounter (Signed)
Scheduled follow-up appointment per 12/31 staff message. Patient is aware.

## 2020-01-09 ENCOUNTER — Inpatient Hospital Stay: Payer: Medicare PPO | Attending: Medical | Admitting: Medical

## 2020-01-09 ENCOUNTER — Other Ambulatory Visit: Payer: Self-pay

## 2020-01-09 VITALS — BP 153/92 | HR 78 | Temp 97.5°F | Resp 16 | Ht 66.5 in | Wt 122.4 lb

## 2020-01-09 DIAGNOSIS — M545 Low back pain, unspecified: Secondary | ICD-10-CM | POA: Diagnosis not present

## 2020-01-09 DIAGNOSIS — N644 Mastodynia: Secondary | ICD-10-CM | POA: Diagnosis not present

## 2020-01-09 DIAGNOSIS — C50411 Malignant neoplasm of upper-outer quadrant of right female breast: Secondary | ICD-10-CM

## 2020-01-09 DIAGNOSIS — R0781 Pleurodynia: Secondary | ICD-10-CM | POA: Insufficient documentation

## 2020-01-09 DIAGNOSIS — Z170 Estrogen receptor positive status [ER+]: Secondary | ICD-10-CM | POA: Diagnosis not present

## 2020-01-13 ENCOUNTER — Other Ambulatory Visit: Payer: Self-pay

## 2020-01-13 ENCOUNTER — Encounter (HOSPITAL_COMMUNITY)
Admission: RE | Admit: 2020-01-13 | Discharge: 2020-01-13 | Disposition: A | Discharge status: Home / Self Care | Payer: Medicare PPO | Source: Ambulatory Visit | Attending: Medical | Admitting: Medical

## 2020-01-13 DIAGNOSIS — C50411 Malignant neoplasm of upper-outer quadrant of right female breast: Secondary | ICD-10-CM | POA: Diagnosis not present

## 2020-01-13 DIAGNOSIS — M545 Low back pain, unspecified: Secondary | ICD-10-CM | POA: Diagnosis not present

## 2020-01-13 DIAGNOSIS — R0781 Pleurodynia: Secondary | ICD-10-CM | POA: Insufficient documentation

## 2020-01-13 DIAGNOSIS — C50919 Malignant neoplasm of unspecified site of unspecified female breast: Secondary | ICD-10-CM | POA: Diagnosis not present

## 2020-01-13 DIAGNOSIS — Z170 Estrogen receptor positive status [ER+]: Secondary | ICD-10-CM

## 2020-01-13 DIAGNOSIS — M549 Dorsalgia, unspecified: Secondary | ICD-10-CM | POA: Diagnosis not present

## 2020-01-13 MED ORDER — TECHNETIUM TC 99M MEDRONATE IV KIT
21.3000 | PACK | Freq: Once | INTRAVENOUS | Status: AC
  Administered 2020-01-13: 21.3 via INTRAVENOUS

## 2020-01-13 NOTE — Progress Notes (Signed)
[facility] Progress Note   Bradee Zhai [id number] [birth year] 70 y.o.  Breanna Nelson is managed by Dr. Lurline Del  Actively treated with chemotherapy/immunotherapy/hormonal therapy: yes  Current therapy: Tamoxifen  Next scheduled appointment with provider: 02/10/2020  Assessment: Plan:    Malignant neoplasm of upper-outer quadrant of right breast in female, estrogen receptor positive ([place]) - Plan: NM Bone Scan Whole Body  Rib pain on right side - Plan: NM Bone Scan Whole Body  Acute midline low back pain without sciatica - Plan: NM Bone Scan Whole Body  ER positive malignant neoplasm of the right breast: Breanna Nelson continues to be managed by Dr. Jana Hakim and is on daily Tamoxifen. She was last seen on 09/27/2019 and is scheduled to be seen in follow up on 02/20/2020.  Back and right rib pain: Breanna Nelson was referred for a bone scan given her concern for a recurrence of her breat cancer.  Please see After Visit Summary for patient specific instructions.  Future Appointments  Date Time Provider [place]  02/10/2020  9:00 AM [facility] None  02/10/2020  9:30 AM Magrinat, [name], MD [facility] None    Orders Placed This Encounter  Procedures  . NM Bone Scan Whole Body       Subjective:   Patient ID:  Breanna Nelson is a 70 y.o. (DOB [birth year]) female.  Chief Complaint: No chief complaint on file.   HPI Breanna Nelson is a 70 y.o. female with a diagnosis of an ER positive malignant neoplasm of the right breast. She is followed by Dr. Jana Hakim and is on daily Tamoxifen. She was last seen on 09/27/2019. She presents with back pain and right rib pain which she describes as aching, She also reports episodic left breast pain and tingling in her legs and ankles. She had a fall in late 2020 and hurt her back and hit her head. She reports that she has recovered from this. She denies recent trauma or changes in activity. She has  had no unexplained weight loss or anorexia. She is scheduled to see Dr. Jana Hakim next on 02/20/2020.  Medications: I have reviewed the patient's current medications.  Allergies: No Known Allergies  Past Medical History:  Diagnosis Date  . Breast cancer of upper-outer quadrant of right female breast ([place]) dx'd 04/2013  . Osteoporosis     Past Surgical History:  Procedure Laterality Date  . SIMPLE MASTECTOMY W/ SENTINEL NODE BIOPSY Right 06/13/13   by Dr. [name]    Family History  Problem Relation Age of Onset  . Skin cancer Mother   . Testicular cancer Brother     Social History   Socioeconomic History  . Marital status: Widowed    Spouse name: Not on file  . Number of children: 2  . Years of education: Not on file  . Highest education level: Not on file  Occupational History  . Not on file  Tobacco Use  . Smoking status: Never Smoker  . Smokeless tobacco: Never Used  Substance and Sexual Activity  . Alcohol use: No  . Drug use: No  . Sexual activity: Not on file  Other Topics Concern  . Not on file  Social History Narrative  . Not on file   Social Determinants of Health   Financial Resource Strain: Not on file  Food Insecurity: Not on file  Transportation Needs: Not on file  Physical Activity: Not on file  Stress: Not on file  Social Connections: Not on  file  Intimate Partner Violence: Not on file    Past Medical History, Surgical history, Social history, and Family history were reviewed and updated as appropriate.   Please see review of systems for further details on the patient's review from today.   Review of Systems:  Review of Systems  Constitutional: Negative for chills, diaphoresis and fever.  HENT: Negative for trouble swallowing and voice change.   Respiratory: Negative for cough, chest tightness, shortness of breath and wheezing.   Cardiovascular: Positive for chest pain. Negative for palpitations.  Gastrointestinal: Negative for  abdominal pain, constipation, diarrhea, nausea and vomiting.  Musculoskeletal: Positive for back pain. Negative for myalgias.  Neurological: Negative for dizziness, light-headedness and headaches.       Tingling in legs and ankles    Objective:   Physical Exam:  BP (!) 153/92 (BP Location: Left Arm, Patient Position: Sitting)   Pulse 78   Temp (!) 97.5 F (36.4 C) (Tympanic)   Resp 16   Ht 5' 6.5" (1.689 m)   Wt 122 lb 6.4 oz (55.5 kg)   SpO2 100%   BMI 19.46 kg/m  ECOG: 0  Physical Exam Constitutional:      General: She is not in acute distress.    Appearance: She is not diaphoretic.  HENT:     Head: Normocephalic and atraumatic.  Cardiovascular:     Rate and Rhythm: Normal rate and regular rhythm.     Heart sounds: Normal heart sounds. No murmur heard. No friction rub. No gallop.   Pulmonary:     Effort: Pulmonary effort is normal. No respiratory distress.     Breath sounds: Normal breath sounds. No wheezing or rales.  Musculoskeletal:        General: No tenderness.  Skin:    General: Skin is warm and dry.     Findings: No erythema or rash.  Neurological:     Mental Status: She is alert.     Coordination: Coordination normal.     Gait: Gait normal.  Psychiatric:        Mood and Affect: Mood normal.        Behavior: Behavior normal.        Thought Content: Thought content normal.        Judgment: Judgment normal.   Breasts: The right breast is status post mastectomy.  No nodules or mass are appreciated. The left breast is present with nipple everted without discharge, masses, nodules, or skin changes. No bilateral axillary tail lymphadenopathy.  Lab Review:     Component Value Date/Time   NA 138 09/27/2019 1330   NA 139 09/12/2016 1235   K 4.1 09/27/2019 1330   K 3.5 09/12/2016 1235   CL 103 09/27/2019 1330   CO2 30 09/27/2019 1330   CO2 28 09/12/2016 1235   GLUCOSE 96 09/27/2019 1330   GLUCOSE 118 09/12/2016 1235   BUN 12 09/27/2019 1330   BUN 11.9  09/12/2016 1235   CREATININE 0.74 09/27/2019 1330   CREATININE 0.8 09/12/2016 1235   CALCIUM 8.7 (L) 09/27/2019 1330   CALCIUM 8.9 09/12/2016 1235   PROT 6.3 (L) 09/27/2019 1330   PROT 6.4 09/12/2016 1235   ALBUMIN 3.6 09/27/2019 1330   ALBUMIN 3.6 09/12/2016 1235   AST 32 09/27/2019 1330   AST 25 09/12/2016 1235   ALT 22 09/27/2019 1330   ALT 15 09/12/2016 1235   ALKPHOS 49 09/27/2019 1330   ALKPHOS 53 09/12/2016 1235   BILITOT 0.5 09/27/2019 1330  BILITOT 0.32 09/12/2016 1235   GFRNONAA >60 09/27/2019 1330   GFRAA >60 09/27/2019 1330       Component Value Date/Time   WBC 6.7 09/27/2019 1330   WBC 6.2 02/28/2019 1520   RBC 4.29 09/27/2019 1330   HGB 13.7 09/27/2019 1330   HGB 12.4 09/12/2016 1235   HCT 40.7 09/27/2019 1330   HCT 38.1 09/12/2016 1235   PLT 193 09/27/2019 1330   PLT 216 09/12/2016 1235   MCV 94.9 09/27/2019 1330   MCV 86.6 09/12/2016 1235   MCH 31.9 09/27/2019 1330   MCHC 33.7 09/27/2019 1330   RDW 13.1 09/27/2019 1330   RDW 19.8 (H) 09/12/2016 1235   LYMPHSABS 1.8 09/27/2019 1330   LYMPHSABS 1.6 09/12/2016 1235   MONOABS 0.7 09/27/2019 1330   MONOABS 0.6 09/12/2016 1235   EOSABS 0.1 09/27/2019 1330   EOSABS 0.0 09/12/2016 1235   BASOSABS 0.1 09/27/2019 1330   BASOSABS 0.0 09/12/2016 1235   -------------------------------  Imaging from last 24 hours (if applicable):  Radiology interpretation: NM Bone Scan Whole Body  Result Date: 01/13/2020 CLINICAL DATA:  Breast cancer surveillance, progressive back and RIGHT rib pain EXAM: NUCLEAR MEDICINE WHOLE BODY BONE SCAN TECHNIQUE: Whole body anterior and posterior images were obtained approximately 3 hours after intravenous injection of radiopharmaceutical. RADIOPHARMACEUTICALS:  21.3 mCi Technetium-31m MDP IV COMPARISON:  None Radiographic correlation: None recent; RIGHT rib radiographs 10/01/2018 FINDINGS: Questionable increased tracer uptake at the posterior RIGHT eighth rib. No radiographic  abnormality seen at this site on rib radiographs from 2020. No additional sites of abnormal osseous tracer accumulation. Expected urinary tract and soft tissue distribution of tracer. IMPRESSION: Questionable abnormal tracer uptake at posterior RIGHT eighth rib; since the prior radiographs are 24.72 years old, recommend repeat radiographs. Otherwise negative exam. Electronically Signed   By: Lavonia Dana M.D.   On: 01/13/2020 12:13

## 2020-01-14 ENCOUNTER — Telehealth: Payer: Self-pay | Admitting: *Deleted

## 2020-01-14 ENCOUNTER — Other Ambulatory Visit: Payer: Self-pay | Admitting: *Deleted

## 2020-01-14 DIAGNOSIS — Z170 Estrogen receptor positive status [ER+]: Secondary | ICD-10-CM

## 2020-01-14 DIAGNOSIS — C50411 Malignant neoplasm of upper-outer quadrant of right female breast: Secondary | ICD-10-CM

## 2020-01-14 NOTE — Progress Notes (Signed)
Dg che

## 2020-01-14 NOTE — Telephone Encounter (Signed)
This RN spoke with the patient's daughter- Lattie Haw- per message on nurse line stating " mom read her results and she is concerned "  Note per Lattie Haw- she states her mother's memory is not good- and she had another fall since the previous one which we may not be aware of.  This RN reviewed bone scan result and stated need for additional views in comparison.  Lattie Haw verbalized understanding as well as her goal of being able to support her mom with information and processing for best mental outcome.  Order entered as well as message left on VM for pt .

## 2020-01-16 DIAGNOSIS — Z01419 Encounter for gynecological examination (general) (routine) without abnormal findings: Secondary | ICD-10-CM | POA: Diagnosis not present

## 2020-01-16 DIAGNOSIS — Z853 Personal history of malignant neoplasm of breast: Secondary | ICD-10-CM | POA: Diagnosis not present

## 2020-01-16 DIAGNOSIS — N950 Postmenopausal bleeding: Secondary | ICD-10-CM | POA: Diagnosis not present

## 2020-01-28 ENCOUNTER — Ambulatory Visit (HOSPITAL_COMMUNITY)
Admission: RE | Admit: 2020-01-28 | Discharge: 2020-01-28 | Disposition: A | Discharge status: Home / Self Care | Payer: Medicare PPO | Source: Ambulatory Visit | Attending: Oncology | Admitting: Oncology

## 2020-01-28 ENCOUNTER — Telehealth: Payer: Self-pay | Admitting: *Deleted

## 2020-01-28 ENCOUNTER — Other Ambulatory Visit: Payer: Self-pay

## 2020-01-28 DIAGNOSIS — C50411 Malignant neoplasm of upper-outer quadrant of right female breast: Secondary | ICD-10-CM | POA: Insufficient documentation

## 2020-01-28 DIAGNOSIS — R296 Repeated falls: Secondary | ICD-10-CM | POA: Diagnosis not present

## 2020-01-28 DIAGNOSIS — Z170 Estrogen receptor positive status [ER+]: Secondary | ICD-10-CM

## 2020-01-28 DIAGNOSIS — Z9011 Acquired absence of right breast and nipple: Secondary | ICD-10-CM | POA: Diagnosis not present

## 2020-01-28 DIAGNOSIS — M47814 Spondylosis without myelopathy or radiculopathy, thoracic region: Secondary | ICD-10-CM | POA: Diagnosis not present

## 2020-01-28 NOTE — Telephone Encounter (Signed)
This RN returned call to per her VM with concerns due to recent pelvic exam and wanting additional follow up,  Shandell states she has not had a pelvic exam for years " because I didn't think I needed one but I am on the tamoxifen now 7 years "  She states she has had a general discharge but at times " it's darker " - she was concerned and went to her primary MD's PA - who did a pelvic exam - " she checked me with her hand and when she pulled it out there was blood on it "  She states they were supposed to refer her to a GYN but she has not heard and is concerned.  Note she is not having frank blood discharge- just darker mucus discharge.  She was hoping we had a female GYN here who could do a full pelvic exam.  This RN discussed above relating to use of tamoxifen in a woman her age causing vaginal dryness - as well as she is not having intercourse ( husband passed in 2015).  This RN informed her above would be reviewed with Dr Jana Hakim- and this RN will follow up with her primary MD's office to see how we can assist with her concerns further.  Windy Fast verbalized appreciation of call.

## 2020-02-06 ENCOUNTER — Telehealth: Payer: Self-pay | Admitting: *Deleted

## 2020-02-06 NOTE — Telephone Encounter (Signed)
This RN contacted Dr Pennie Banter office to follow up on referral to GYN per pt's call and concern for vaginal spotting on tamoxifen.  Pt has been referred to Dr Kathryne Eriksson at [facility] with appt 02/27/2020.

## 2020-02-10 ENCOUNTER — Ambulatory Visit: Payer: Medicare PPO | Admitting: Oncology

## 2020-02-10 ENCOUNTER — Other Ambulatory Visit: Payer: Medicare PPO

## 2020-02-27 DIAGNOSIS — Z124 Encounter for screening for malignant neoplasm of cervix: Secondary | ICD-10-CM | POA: Diagnosis not present

## 2020-02-27 DIAGNOSIS — N760 Acute vaginitis: Secondary | ICD-10-CM | POA: Diagnosis not present

## 2020-02-27 DIAGNOSIS — N950 Postmenopausal bleeding: Secondary | ICD-10-CM | POA: Diagnosis not present

## 2020-03-02 NOTE — Progress Notes (Signed)
Breanna Nelson  Telephone:([phone]) [phone] Fax:(336) [phone]     ID: Breanna Nelson DOB: [birth year]  MR#: [id number]  CSN#:[id number]  Patient Care Team: Breanna Pretty, MD as PCP - General (Internal Medicine) Breanna Nelson, Breanna Dad, MD as Consulting Physician (Oncology) Breanna Luna, MD as Consulting Physician (General Surgery) Breanna Pray, MD as Consulting Physician (Radiation Oncology) Breanna Pearson, MD as Consulting Physician (Obstetrics and Gynecology) OTHER MD: Breanna Nelson, surgery  CHIEF COMPLAINT: Estrogen receptor positive breast cancer (s/p right mastectomy)  CURRENT TREATMENT: Completing 6-1/2 years tamoxifen   INTERVAL HISTORY: "Breanna Nelson" returns today for follow-up of her estrogen receptor positive breast cancer accompanied by her daughter Breanna Nelson.    She continues taking tamoxifen daily and tolerates that well.  However recently she noted a little bit of blood in her panties.  She brought this to her primary care physician and was referred to Breanna Nelson who did a Pap smear.  The patient is set up for ultrasound of the uterus to evaluate for polyps versus cancer versus hyperplasia  Breanna Nelson saw our physician's assistant Breanna Nelson for rib cage pain on the right.  Since her last visit, she underwent bone scan on 01/13/2020 showing: questionable abnormal tracer uptake at posterior right 8th rib; otherwise negative exam.  She proceeded to chest x-ray on 01/28/2020 showing no abnormalities.   REVIEW OF SYSTEMS: Breanna Nelson no longer feels as tired as she did before.  She is cleaning 1 house only, up to 2 days a week.  She does all her regular activities.  She has pains in her back as she puts it which is really more in the posterior right lateral area.  It comes and goes and is not constant.  It is not progressive.  It is not associated with pleural a detailed review of systems was otherwise stable.   COVID 19 VACCINATION STATUS:      BREAST CANCER HISTORY: From the  original intake note:  The patient herself noted a lump in her right breast late April 20 15th. She brought it to her primary care physician's attention and on 05/06/2013 had a bilateral mammography with tomography at [facility]. The breast density was category C. There was a new area of architectural distortion at the 11:00 position of the right breast with no other significant findings. Accordingly a right breast ultrasound was performed the same day. This identified a 1 cm irregular mass in the right breast at the 10:00 position. This correlated well with the palpable findings and biopsy of the mass was obtained the same day. This showed ([id number]) and invasive mammary carcinoma, with strong diffuse expression of E-cadherin both an invasive and in situ carcinomas, supporting a ductal etiology. Estrogen receptor was 100% positive, progesterone receptor was 96% positive, with strong staining intensity, the MIB-1 was 3%, and there was no HER-2 amplification, the signals ratio being 1.3 and the number per cell 2.5.  The patient underwent bilateral breast MRI 05/13/2013. This confirmed, in the upper outer quadrant of the right breast, several small nodules, associated with an area of postbiopsy hematoma. Taking together the area of enhancement and hematoma the largest measuring was 1.8 cm. There were no masses or abnormal enhancement Nelson in the left breast and no suspicious lymph nodes.  The patient was Nelson in the multidisciplinary clinic by surgery and radiation oncology 05/15/2013. After discussion here and also with Breanna. Nicole Nelson, and with a good understanding of the equivalent results between mastectomy and breast conservation surgery plus radiation, the patient  opted for mastectomy (without planned reconstruction) and sentinel lymph node sampling. This was performed 06/13/2013 at [facility]. There was some postoperative bleeding requiring evacuation of a hematoma. The final pathology ([id number]) showed  a 1.5 cm invasive mammary carcinoma (either at lobular breast cancer with the unusual feature of being E-cadherin positive, or a ductal breast cancer with lobular features), grade 2, with a positive intramammary lymph node. The sentinel lymph node and 4 additional axillary lymph nodes were negative. The tumor was again strongly estrogen and progesterone receptor positive, again HER-2 negative by FISH, with a low MIB-1-1, not read as 10%. Margins were negative.  On 07/03/2013 the patient was reevaluated by radiation oncology at they agreed that postmastectomy radiation was not indicated. They suggested consultation to medical oncology to consider adjuvant hormonal therapy.  The patient's subsequent history is as detailed below.   PAST MEDICAL HISTORY: Past Medical History:  Diagnosis Date  . Breast cancer of upper-outer quadrant of right female breast (Breanna Nelson) dx'd 04/2013  . Osteoporosis     PAST SURGICAL HISTORY: Past Surgical History:  Procedure Laterality Date  . SIMPLE MASTECTOMY W/ SENTINEL NODE BIOPSY Right 06/13/13   by Breanna. Theodoro Nelson    FAMILY HISTORY Family History  Problem Relation Age of Onset  . Skin cancer Mother   . Testicular cancer Brother    the patient's father died at the age of 57. The patient's mother died at the age of 60 from heart disease. She also had Alzheimer's disease. The patient had one brother who died in the age of 78 with testicular cancer. One sister died as a baby. There is no history of breast or ovarian cancer in the family.   GYNECOLOGIC HISTORY:  No LMP recorded. Patient is postmenopausal. Menarche age 28, first live birth age 34. The patient is GX P2. She does not recall when she went through menopause, but he was more than 10 years ago. She never took hormone replacement. She did take birth control pills for a brief period, with no complications   SOCIAL HISTORY:  Breanna Nelson works at [employer]. At home it's just she and her dog Breanna Nelson. Her  husband died from complications of lymphoma. He also had undergone a colectomy in 2004. "He had more than 200 polyps". The patient's daughter Breanna Nelson lives in [place] where she works as a Engineer, structural. She also does [employer]. Her son Breanna Nelson is a truck driver.  He recently underwent colonoscopy.  He lives near [place], in [place].  The patient has 4 grandchildren. The patient is a [employer].--[name] tells me she is fianced to Breanna Nelson, who is a church member with her.  Unfortunately he has significant COPD problems and is on oxygen.  That is why she was tested for coronavirus earlier this year.    ADVANCED DIRECTIVES: Not in place; the patient intends to name her daughter Breanna Nelson as her healthcare power of attorney.   HEALTH MAINTENANCE: Social History   Tobacco Use  . Smoking status: Never Smoker  . Smokeless tobacco: Never Used  Substance Use Topics  . Alcohol use: No  . Drug use: No     Colonoscopy:  PAP:  Bone density:   Lipid panel:  No Known Allergies  Current Outpatient Medications  Medication Sig Dispense Refill  . amLODipine (NORVASC) 5 MG tablet     . Calcium 1200-1000 MG-UNIT CHEW Chew 1 capsule by mouth 2 (two) times daily.     . Cholecalciferol (VITAMIN D-3)  1000 UNITS CAPS Take 1 capsule by mouth 2 (two) times daily.     . tamoxifen (NOLVADEX) 20 MG tablet TAKE 1 TABLET EVERY DAY 90 tablet 4  . vitamin C (ASCORBIC ACID) 500 MG tablet Take 500 mg by mouth daily.     No current facility-administered medications for this visit.    OBJECTIVE: White woman who appears stated age  70:   03/03/20 1444  BP: (!) 149/93  Pulse: 79  Resp: 18  Temp: (!) 97.5 F (36.4 C)  SpO2: 100%     Body mass index is 19.06 kg/m.    ECOG FS:1 - Symptomatic but completely ambulatory  Sclerae unicteric, EOMs intact Wearing a mask No cervical or supraclavicular adenopathy Lungs no rales or rhonchi Heart regular rate and rhythm Abd soft, nontender,  positive bowel sounds MSK no focal spinal tenderness to thorough palpation and percussion, no pain to anterior lateral chest compression, on lateral chest compression there was slight discomfort in the right lateral ribs, no upper extremity lymphedema Neuro: nonfocal, well oriented, appropriate affect Breasts: Status post right mastectomy.  There is no evidence of local recurrence.  Left breast is benign.  Both axillae are benign   LAB RESULTS:  CMP     Component Value Date/Time   NA 138 03/03/2020 1424   NA 139 09/12/2016 1235   K 4.1 03/03/2020 1424   K 3.5 09/12/2016 1235   CL 103 03/03/2020 1424   CO2 27 03/03/2020 1424   CO2 28 09/12/2016 1235   GLUCOSE 113 (H) 03/03/2020 1424   GLUCOSE 118 09/12/2016 1235   BUN 16 03/03/2020 1424   BUN 11.9 09/12/2016 1235   CREATININE 0.77 03/03/2020 1424   CREATININE 0.8 09/12/2016 1235   CALCIUM 8.7 (L) 03/03/2020 1424   CALCIUM 8.9 09/12/2016 1235   PROT 6.4 (L) 03/03/2020 1424   PROT 6.4 09/12/2016 1235   ALBUMIN 3.8 03/03/2020 1424   ALBUMIN 3.6 09/12/2016 1235   AST 30 03/03/2020 1424   AST 25 09/12/2016 1235   ALT 22 03/03/2020 1424   ALT 15 09/12/2016 1235   ALKPHOS 46 03/03/2020 1424   ALKPHOS 53 09/12/2016 1235   BILITOT 0.7 03/03/2020 1424   BILITOT 0.32 09/12/2016 1235   GFRNONAA >60 03/03/2020 1424   GFRAA >60 09/27/2019 1330    No results found for: TOTALPROTELP, ALBUMINELP, A1GS, A2GS, BETS, BETA2SER, GAMS, MSPIKE, SPEI  Lab Results  Component Value Date   WBC 7.0 03/03/2020   NEUTROABS 4.6 03/03/2020   HGB 13.4 03/03/2020   HCT 40.4 03/03/2020   MCV 95.3 03/03/2020   PLT 190 03/03/2020    No results found for: LABCA2  No components found for: XLKGMW102  No results for input(s): INR in the last 168 hours.  No results found for: LABCA2  No results found for: VOZ366  No results found for: YQI347  No results found for: QQV956  No results found for: CA2729  No components found for: HGQUANT  No  results found for: CEA1 / No results found for: CEA1   No results found for: AFPTUMOR  No results found for: CHROMOGRNA  No results found for: KPAFRELGTCHN, LAMBDASER, KAPLAMBRATIO (kappa/lambda light chains)  No results found for: HGBA, HGBA2QUANT, HGBFQUANT, HGBSQUAN (Hemoglobinopathy evaluation)   No results found for: LDH  No results found for: IRON, TIBC, IRONPCTSAT (Iron and TIBC)  Lab Results  Component Value Date   FERRITIN 19 09/07/2017    Urinalysis No results found for: COLORURINE, APPEARANCEUR, [place], [place], GLUCOSEU,  HGBUR, BILIRUBINUR, KETONESUR, PROTEINUR, UROBILINOGEN, NITRITE, LEUKOCYTESUR   STUDIES: No results found.   ASSESSMENT: 70 y.o. [place] woman status post right mastectomy and sentinel lymph node sampling 06/13/2013 for an upper outer quadrant pT2 pN1, stage IIB invasive mammary carcinoma (either an invasive ductal carcinoma with lobular features, or a lobular carcinoma that was E-cadherin strongly positive), estrogen and progesterone receptor positive, HER-2 nonamplified, with a low MIB-1  (1) there was no indication for postmastectomy radiation  (2) the patient opted against adjuvant chemotherapy  (3) tamoxifen started September 2015, discontinued February 2022  (4) osteoporosis by bone density scan 09/18/2009   PLAN: Breanna Nelson is now just about 7 years out from definitive surgery for her breast cancer with no evidence of disease recurrence.  This is very favorable.  We have continued tamoxifen beyond 5years partly because of her osteoporosis, but given that she appears to have had some vaginal bleeding we are stopping the medication at this point.  She has about a month left and she will simply discard that.  Breanna Nelson and her daughter Breanna Nelson understood that the recently obtain chest x-ray was to be used to clarify the questionable findings on the bone scan.  That was not done.  The chest x-ray was simply read as a negative chest x-ray and was not  correlated.  They asked me to contact radiology to get that done and I will be very glad to do that.  However I did say that it is difficult to prove negative and that simply because we do not see much on the chest x-ray does not mean that we can be 100% sure there is not something there.  If they really would like to know we would have to obtain a CT scan of the chest.  However they really do not want to obtain any further films unless necessary because of cost issues.  My own feeling is that this is extremely unlikely to be cancer and that more likely it is related to her fall from a year and a half ago which is what prompted the original set of rib films  I am more concerned about the possible postmenopausal bleeding issue.  She is scheduled for an ultrasound of the uterus on 03/13/2020.  I will have a virtual visit with the patient on 03/19/2020 to review all of the above.  At that point we can also decide whether she would like further imaging or consider "graduating" from follow-up here either now or more likely in 6 to 8 months.  Total encounter time 40 minutes.Sarajane Jews C. Jahmya Onofrio, MD 03/03/20 3:33 PM Medical Oncology and Hematology [facility] [place], [place] [address] Tel. [phone]    Fax. [phone]   I, Wilburn Mylar, am acting as scribe for Breanna. Virgie Nelson. Balraj Brayfield.  I, Lurline Del MD, have reviewed the above documentation for accuracy and completeness, and I agree with the above.    *Total Encounter Time as defined by the Centers for Medicare and Medicaid Services includes, in addition to the face-to-face time of a patient visit (documented in the note above) non-face-to-face time: obtaining and reviewing outside history, ordering and reviewing medications, tests or procedures, care coordination (communications with other health care professionals or caregivers) and documentation in the medical record.

## 2020-03-03 ENCOUNTER — Telehealth: Payer: Self-pay | Admitting: Oncology

## 2020-03-03 ENCOUNTER — Inpatient Hospital Stay (HOSPITAL_BASED_OUTPATIENT_CLINIC_OR_DEPARTMENT_OTHER): Payer: Medicare PPO | Admitting: Oncology

## 2020-03-03 ENCOUNTER — Other Ambulatory Visit: Payer: Self-pay

## 2020-03-03 ENCOUNTER — Inpatient Hospital Stay: Payer: Medicare PPO | Attending: Medical

## 2020-03-03 VITALS — BP 149/93 | HR 79 | Temp 97.5°F | Resp 18 | Ht 66.5 in | Wt 119.9 lb

## 2020-03-03 DIAGNOSIS — Z170 Estrogen receptor positive status [ER+]: Secondary | ICD-10-CM | POA: Diagnosis not present

## 2020-03-03 DIAGNOSIS — M810 Age-related osteoporosis without current pathological fracture: Secondary | ICD-10-CM | POA: Insufficient documentation

## 2020-03-03 DIAGNOSIS — Z853 Personal history of malignant neoplasm of breast: Secondary | ICD-10-CM | POA: Diagnosis not present

## 2020-03-03 DIAGNOSIS — N950 Postmenopausal bleeding: Secondary | ICD-10-CM | POA: Diagnosis not present

## 2020-03-03 DIAGNOSIS — C50411 Malignant neoplasm of upper-outer quadrant of right female breast: Secondary | ICD-10-CM

## 2020-03-03 DIAGNOSIS — Z9011 Acquired absence of right breast and nipple: Secondary | ICD-10-CM | POA: Diagnosis not present

## 2020-03-03 LAB — CMP (CANCER CENTER ONLY)
ALT: 22 U/L (ref 0–44)
AST: 30 U/L (ref 15–41)
Albumin: 3.8 g/dL (ref 3.5–5.0)
Alkaline Phosphatase: 46 U/L (ref 38–126)
Anion gap: 8 (ref 5–15)
BUN: 16 mg/dL (ref 8–23)
CO2: 27 mmol/L (ref 22–32)
Calcium: 8.7 mg/dL — ABNORMAL LOW (ref 8.9–10.3)
Chloride: 103 mmol/L (ref 98–111)
Creatinine: 0.77 mg/dL (ref 0.44–1.00)
GFR, Estimated: >60 mL/min (ref >60)
Glucose, Bld: 113 mg/dL — ABNORMAL HIGH (ref 70–99)
Potassium: 4.1 mmol/L (ref 3.5–5.1)
Sodium: 138 mmol/L (ref 135–145)
Total Bilirubin: 0.7 mg/dL (ref 0.3–1.2)
Total Protein: 6.4 g/dL — ABNORMAL LOW (ref 6.5–8.1)

## 2020-03-03 LAB — CBC WITH DIFFERENTIAL (CANCER CENTER ONLY)
Abs Immature Granulocytes: 0.01 10*3/uL (ref 0.00–0.07)
Basophils Absolute: 0.1 10*3/uL (ref 0.0–0.1)
Basophils Relative: 1 %
Eosinophils Absolute: 0 10*3/uL (ref 0.0–0.5)
Eosinophils Relative: 1 %
HCT: 40.4 % (ref 36.0–46.0)
Hemoglobin: 13.4 g/dL (ref 12.0–15.0)
Immature Granulocytes: 0 %
Lymphocytes Relative: 23 %
Lymphs Abs: 1.6 10*3/uL (ref 0.7–4.0)
MCH: 31.6 pg (ref 26.0–34.0)
MCHC: 33.2 g/dL (ref 30.0–36.0)
MCV: 95.3 fL (ref 80.0–100.0)
Monocytes Absolute: 0.7 10*3/uL (ref 0.1–1.0)
Monocytes Relative: 10 %
Neutro Abs: 4.6 10*3/uL (ref 1.7–7.7)
Neutrophils Relative %: 65 %
Platelet Count: 190 10*3/uL (ref 150–400)
RBC: 4.24 MIL/uL (ref 3.87–5.11)
RDW: 12.8 % (ref 11.5–15.5)
WBC Count: 7 10*3/uL (ref 4.0–10.5)
nRBC: 0 % (ref 0.0–0.2)

## 2020-03-03 NOTE — Telephone Encounter (Signed)
Scheduled appts per 3/1 los. Gave pt a print out of AVS.

## 2020-03-13 DIAGNOSIS — N950 Postmenopausal bleeding: Secondary | ICD-10-CM | POA: Diagnosis not present

## 2020-03-13 DIAGNOSIS — Z79810 Long term (current) use of selective estrogen receptor modulators (SERMs): Secondary | ICD-10-CM | POA: Diagnosis not present

## 2020-03-17 ENCOUNTER — Encounter (HOSPITAL_BASED_OUTPATIENT_CLINIC_OR_DEPARTMENT_OTHER): Payer: Self-pay | Admitting: Obstetrics and Gynecology

## 2020-03-17 ENCOUNTER — Other Ambulatory Visit: Payer: Self-pay

## 2020-03-17 NOTE — H&P (Addendum)
Breanna Nelson is an 70 y.o. female presents for surgical evaluation of PMB.  H/o breast cancer - was taking tamoxifen (stopped 2 wks ago)  Menstrual History:  No LMP recorded. Patient is postmenopausal.    Past Medical History:  Diagnosis Date  . Anxiety   . Breast cancer of upper-outer quadrant of right female breast ([place]) dx'd 04/2013   mastectomy with lymph node removed,   . Hypertension   . Osteoporosis   . PMB (postmenopausal bleeding)    last 3 months  . Wears glasses    for reading    Past Surgical History:  Procedure Laterality Date  . SIMPLE MASTECTOMY W/ SENTINEL NODE BIOPSY Right 06/13/13   by Dr. [name]    Family History  Problem Relation Age of Onset  . Skin cancer Mother   . Testicular cancer Brother     Social History:  reports that she has never smoked. She has never used smokeless tobacco. She reports that she does not drink alcohol and does not use drugs.  Allergies: No Known Allergies  Meds:  amlodipine  Review of Systems  Height 5' 6.5" (1.689 m), weight 54 kg. Physical Exam  Gen - NAD CV - RRR Lungs - clear Abd - soft, NT PV - uterus mobile and NT.  No blood noted in vagina Ext - NT, no edema  PV [place]:  Fluid in endometrial cavity.  EMS 11.28mm.  No adnexal mass or free fluid Pap negative  Assessment/Plan:  PMB while on tamoxifen with thickened EMS Hysteroscopy with D&C and possible polypectomy R/b/a discussed, questions answered, informed consent  [name] 03/17/2020, 9:45 AM

## 2020-03-17 NOTE — Progress Notes (Signed)
Spoke w/ via phone for pre-op interview---pt Lab needs dos----none               Lab results------has lab appt 03-18-2020 cbc bmp and ekg COVID test ------03-18-2020 1500 Arrive at -------530 am 03-20-2020  NPO after MN NO Solid Food.  Clear liquids from MN until---430 am thrn npo Med rec completed Medications to take morning of surgery -----none Diabetic medication -----n/a Patient instructed to bring photo id and insurance card day of surgery Patient aware to have Driver (ride ) / caregiver daughter lisa allred cell [phone] cell    for 24 hours after surgery  Patient Special Instructions -----none Pre-Op special Istructions -----none Patient verbalized understanding of instructions that were given at this phone interview. Patient denies shortness of breath, chest pain, fever, cough at this phone interview.

## 2020-03-18 ENCOUNTER — Encounter (HOSPITAL_COMMUNITY)
Admission: RE | Admit: 2020-03-18 | Discharge: 2020-03-18 | Disposition: A | Discharge status: Home / Self Care | Payer: Medicare PPO | Source: Ambulatory Visit | Attending: Obstetrics and Gynecology | Admitting: Obstetrics and Gynecology

## 2020-03-18 ENCOUNTER — Other Ambulatory Visit (HOSPITAL_COMMUNITY)
Admission: RE | Admit: 2020-03-18 | Discharge: 2020-03-18 | Disposition: A | Discharge status: Home / Self Care | Payer: Medicare PPO | Source: Ambulatory Visit | Attending: Obstetrics and Gynecology | Admitting: Obstetrics and Gynecology

## 2020-03-18 ENCOUNTER — Telehealth: Payer: Self-pay

## 2020-03-18 ENCOUNTER — Telehealth: Payer: Self-pay | Admitting: Oncology

## 2020-03-18 DIAGNOSIS — Z20822 Contact with and (suspected) exposure to covid-19: Secondary | ICD-10-CM | POA: Diagnosis not present

## 2020-03-18 DIAGNOSIS — Z01818 Encounter for other preprocedural examination: Secondary | ICD-10-CM | POA: Insufficient documentation

## 2020-03-18 LAB — BASIC METABOLIC PANEL
Anion gap: 14 (ref 5–15)
BUN: 13 mg/dL (ref 8–23)
CO2: 23 mmol/L (ref 22–32)
Calcium: 9.2 mg/dL (ref 8.9–10.3)
Chloride: 103 mmol/L (ref 98–111)
Creatinine, Ser: 0.63 mg/dL (ref 0.44–1.00)
GFR, Estimated: >60 mL/min (ref >60)
Glucose, Bld: 100 mg/dL — ABNORMAL HIGH (ref 70–99)
Potassium: 4.9 mmol/L (ref 3.5–5.1)
Sodium: 140 mmol/L (ref 135–145)

## 2020-03-18 LAB — SARS CORONAVIRUS 2 (TAT 6-24 HRS): SARS Coronavirus 2: NEGATIVE

## 2020-03-18 LAB — CBC
HCT: 42.7 % (ref 36.0–46.0)
Hemoglobin: 14.3 g/dL (ref 12.0–15.0)
MCH: 32.3 pg (ref 26.0–34.0)
MCHC: 33.5 g/dL (ref 30.0–36.0)
MCV: 96.4 fL (ref 80.0–100.0)
Platelets: 213 10*3/uL (ref 150–400)
RBC: 4.43 MIL/uL (ref 3.87–5.11)
RDW: 12.7 % (ref 11.5–15.5)
WBC: 6.6 10*3/uL (ref 4.0–10.5)
nRBC: 0 % (ref 0.0–0.2)

## 2020-03-18 LAB — TYPE AND SCREEN
ABO/RH(D): O NEG
Antibody Screen: NEGATIVE

## 2020-03-18 NOTE — Telephone Encounter (Signed)
Scheduled per 03/16 scheduled per scheduled message, patient has been called and voicemail was left.

## 2020-03-18 NOTE — Telephone Encounter (Signed)
Returned call to pt . Pt needed to change video visit appointment from tomorrow to a later date due to upcoming surgery. Message sent to scheduling.

## 2020-03-19 ENCOUNTER — Telehealth: Payer: Medicare PPO | Admitting: Oncology

## 2020-03-19 NOTE — Anesthesia Preprocedure Evaluation (Addendum)
Anesthesia Evaluation  Patient identified by MRN, date of birth, ID band Patient awake    Reviewed: Allergy & Precautions, NPO status , Patient's Chart, lab work & pertinent test results  History of Anesthesia Complications Negative for: history of anesthetic complications  Airway Mallampati: I  TM Distance: >3 FB Neck ROM: Full    Dental  (+) Dental Advisory Given, Caps   Pulmonary neg pulmonary ROS,  03/18/2020 SARS coronavirus NEG   breath sounds clear to auscultation       Cardiovascular hypertension, Pt. on medications negative cardio ROS   Rhythm:Regular Rate:Normal     Neuro/Psych negative neurological ROS     GI/Hepatic negative GI ROS, Neg liver ROS,   Endo/Other  negative endocrine ROS  Renal/GU negative Renal ROS     Musculoskeletal   Abdominal   Peds  Hematology negative hematology ROS (+)   Anesthesia Other Findings Breast cancer  Reproductive/Obstetrics                            Anesthesia Physical Anesthesia Plan  ASA: II  Anesthesia Plan: General   Post-op Pain Management:    Induction:   PONV Risk Score and Plan: 2 and Ondansetron and Dexamethasone  Airway Management Planned: LMA  Additional Equipment: None  Intra-op Plan:   Post-operative Plan:   Informed Consent: I have reviewed the patients History and Physical, chart, labs and discussed the procedure including the risks, benefits and alternatives for the proposed anesthesia with the patient or authorized representative who has indicated his/her understanding and acceptance.     Dental advisory given  Plan Discussed with: CRNA and Surgeon  Anesthesia Plan Comments:        Anesthesia Quick Evaluation

## 2020-03-20 ENCOUNTER — Other Ambulatory Visit: Payer: Self-pay

## 2020-03-20 ENCOUNTER — Encounter (HOSPITAL_BASED_OUTPATIENT_CLINIC_OR_DEPARTMENT_OTHER): Payer: Self-pay | Admitting: Obstetrics and Gynecology

## 2020-03-20 ENCOUNTER — Ambulatory Visit (HOSPITAL_BASED_OUTPATIENT_CLINIC_OR_DEPARTMENT_OTHER)
Admission: RE | Admit: 2020-03-20 | Discharge: 2020-03-20 | Disposition: A | Discharge status: Home / Self Care | Payer: Medicare PPO | Attending: Obstetrics and Gynecology | Admitting: Obstetrics and Gynecology

## 2020-03-20 ENCOUNTER — Ambulatory Visit (HOSPITAL_BASED_OUTPATIENT_CLINIC_OR_DEPARTMENT_OTHER): Payer: Medicare PPO | Admitting: Anesthesiology

## 2020-03-20 ENCOUNTER — Encounter (HOSPITAL_BASED_OUTPATIENT_CLINIC_OR_DEPARTMENT_OTHER)
Admission: RE | Disposition: A | Discharge status: Home / Self Care | Payer: Self-pay | Source: Home / Self Care | Attending: Obstetrics and Gynecology

## 2020-03-20 DIAGNOSIS — N882 Stricture and stenosis of cervix uteri: Secondary | ICD-10-CM | POA: Insufficient documentation

## 2020-03-20 DIAGNOSIS — I10 Essential (primary) hypertension: Secondary | ICD-10-CM | POA: Diagnosis not present

## 2020-03-20 DIAGNOSIS — N840 Polyp of corpus uteri: Secondary | ICD-10-CM | POA: Insufficient documentation

## 2020-03-20 DIAGNOSIS — Z808 Family history of malignant neoplasm of other organs or systems: Secondary | ICD-10-CM | POA: Insufficient documentation

## 2020-03-20 DIAGNOSIS — N950 Postmenopausal bleeding: Secondary | ICD-10-CM | POA: Diagnosis not present

## 2020-03-20 DIAGNOSIS — Z79810 Long term (current) use of selective estrogen receptor modulators (SERMs): Secondary | ICD-10-CM | POA: Diagnosis not present

## 2020-03-20 DIAGNOSIS — Z8043 Family history of malignant neoplasm of testis: Secondary | ICD-10-CM | POA: Diagnosis not present

## 2020-03-20 DIAGNOSIS — C50411 Malignant neoplasm of upper-outer quadrant of right female breast: Secondary | ICD-10-CM | POA: Diagnosis not present

## 2020-03-20 DIAGNOSIS — Z853 Personal history of malignant neoplasm of breast: Secondary | ICD-10-CM | POA: Diagnosis not present

## 2020-03-20 DIAGNOSIS — Z170 Estrogen receptor positive status [ER+]: Secondary | ICD-10-CM | POA: Diagnosis not present

## 2020-03-20 HISTORY — DX: Presence of spectacles and contact lenses: Z97.3

## 2020-03-20 HISTORY — PX: DILATATION & CURETTAGE/HYSTEROSCOPY WITH MYOSURE: SHX6511

## 2020-03-20 HISTORY — DX: Postmenopausal bleeding: N95.0

## 2020-03-20 HISTORY — DX: Essential (primary) hypertension: I10

## 2020-03-20 HISTORY — DX: Anxiety disorder, unspecified: F41.9

## 2020-03-20 LAB — ABO/RH: ABO/RH(D): O NEG

## 2020-03-20 SURGERY — DILATATION & CURETTAGE/HYSTEROSCOPY WITH MYOSURE
Anesthesia: General | Site: Vagina

## 2020-03-20 MED ORDER — ONDANSETRON HCL 4 MG/2ML IJ SOLN
INTRAMUSCULAR | Status: DC | PRN
  Administered 2020-03-20: 4 mg via INTRAVENOUS

## 2020-03-20 MED ORDER — IBUPROFEN 600 MG PO TABS: 600.0000 mg | ORAL_TABLET | Freq: Four times a day (QID) | ORAL | 0 refills | Status: DC | PRN

## 2020-03-20 MED ORDER — FENTANYL CITRATE (PF) 100 MCG/2ML IJ SOLN
INTRAMUSCULAR | Status: AC
  Filled 2020-03-20: qty 2

## 2020-03-20 MED ORDER — PHENYLEPHRINE 40 MCG/ML (10ML) SYRINGE FOR IV PUSH (FOR BLOOD PRESSURE SUPPORT)
PREFILLED_SYRINGE | INTRAVENOUS | Status: DC | PRN
  Administered 2020-03-20 (×3): 40 ug via INTRAVENOUS

## 2020-03-20 MED ORDER — ONDANSETRON HCL 4 MG/2ML IJ SOLN
INTRAMUSCULAR | Status: AC
  Filled 2020-03-20: qty 2

## 2020-03-20 MED ORDER — FENTANYL CITRATE (PF) 100 MCG/2ML IJ SOLN
INTRAMUSCULAR | Status: DC | PRN
  Administered 2020-03-20: 50 ug via INTRAVENOUS

## 2020-03-20 MED ORDER — DEXAMETHASONE SODIUM PHOSPHATE 10 MG/ML IJ SOLN
INTRAMUSCULAR | Status: AC
  Filled 2020-03-20: qty 1

## 2020-03-20 MED ORDER — LIDOCAINE HCL 1 % IJ SOLN
INTRAMUSCULAR | Status: DC | PRN
  Administered 2020-03-20: 7 mL

## 2020-03-20 MED ORDER — PROPOFOL 10 MG/ML IV BOLUS
INTRAVENOUS | Status: DC | PRN
  Administered 2020-03-20: 150 mg via INTRAVENOUS

## 2020-03-20 MED ORDER — LIDOCAINE 2% (20 MG/ML) 5 ML SYRINGE
INTRAMUSCULAR | Status: DC | PRN
  Administered 2020-03-20: 20 mg via INTRAVENOUS

## 2020-03-20 MED ORDER — ACETAMINOPHEN 500 MG PO TABS
1000.0000 mg | ORAL_TABLET | Freq: Once | ORAL | Status: AC
  Administered 2020-03-20: 1000 mg via ORAL

## 2020-03-20 MED ORDER — SODIUM CHLORIDE 0.9 % IV SOLN
2.0000 g | INTRAVENOUS | Status: AC
  Administered 2020-03-20: 2 g via INTRAVENOUS

## 2020-03-20 MED ORDER — SODIUM CHLORIDE 0.9 % IR SOLN
Status: DC | PRN
  Administered 2020-03-20: 2500 mL

## 2020-03-20 MED ORDER — EPHEDRINE 5 MG/ML INJ
INTRAVENOUS | Status: AC
  Filled 2020-03-20: qty 10

## 2020-03-20 MED ORDER — KETOROLAC TROMETHAMINE 15 MG/ML IJ SOLN
INTRAMUSCULAR | Status: DC | PRN
  Administered 2020-03-20: 15 mg via INTRAVENOUS

## 2020-03-20 MED ORDER — DEXAMETHASONE SODIUM PHOSPHATE 10 MG/ML IJ SOLN
INTRAMUSCULAR | Status: DC | PRN
  Administered 2020-03-20: 5 mg via INTRAVENOUS

## 2020-03-20 MED ORDER — LACTATED RINGERS IV SOLN: INTRAVENOUS | Status: DC

## 2020-03-20 MED ORDER — SODIUM CHLORIDE 0.9 % IV SOLN
INTRAVENOUS | Status: AC
  Filled 2020-03-20: qty 2

## 2020-03-20 MED ORDER — ACETAMINOPHEN 500 MG PO TABS
ORAL_TABLET | ORAL | Status: AC
  Filled 2020-03-20: qty 2

## 2020-03-20 MED ORDER — KETOROLAC TROMETHAMINE 30 MG/ML IJ SOLN
INTRAMUSCULAR | Status: AC
  Filled 2020-03-20: qty 1

## 2020-03-20 MED ORDER — PROPOFOL 10 MG/ML IV BOLUS
INTRAVENOUS | Status: AC
  Filled 2020-03-20: qty 20

## 2020-03-20 MED ORDER — POVIDONE-IODINE 10 % EX SWAB: 2.0000 "application " | Freq: Once | CUTANEOUS | Status: DC

## 2020-03-20 MED ORDER — EPHEDRINE SULFATE-NACL 50-0.9 MG/10ML-% IV SOSY
PREFILLED_SYRINGE | INTRAVENOUS | Status: DC | PRN
  Administered 2020-03-20 (×4): 5 mg via INTRAVENOUS

## 2020-03-20 MED ORDER — LIDOCAINE 2% (20 MG/ML) 5 ML SYRINGE
INTRAMUSCULAR | Status: AC
  Filled 2020-03-20: qty 5

## 2020-03-20 SURGICAL SUPPLY — 27 items
BIPOLAR CUTTING LOOP 21FR (ELECTRODE)
CANISTER SUCT 3000ML PPV (MISCELLANEOUS) IMPLANT
CATH ROBINSON RED A/P 16FR (CATHETERS) ×2 IMPLANT
COUNTER NEEDLE 1200 MAGNETIC (NEEDLE) IMPLANT
COVER WAND RF STERILE (DRAPES) ×2 IMPLANT
DEVICE MYOSURE LITE (MISCELLANEOUS) ×2 IMPLANT
DEVICE MYOSURE REACH (MISCELLANEOUS) IMPLANT
DILATOR CANAL MILEX (MISCELLANEOUS) ×2 IMPLANT
GAUZE 4X4 16PLY RFD (DISPOSABLE) IMPLANT
GLOVE SURG ENC MOIS LTX SZ6.5 (GLOVE) ×2 IMPLANT
GLOVE SURG UNDER POLY LF SZ7 (GLOVE) ×2 IMPLANT
GLOVE SURG UNDER POLY LF SZ7.5 (GLOVE) ×4 IMPLANT
GOWN STRL REUS W/TWL LRG LVL3 (GOWN DISPOSABLE) ×2 IMPLANT
GOWN STRL REUS W/TWL XL LVL3 (GOWN DISPOSABLE) ×2 IMPLANT
IV NS IRRIG 3000ML ARTHROMATIC (IV SOLUTION) ×2 IMPLANT
KIT PROCEDURE FLUENT (KITS) ×4 IMPLANT
KIT TURNOVER CYSTO (KITS) ×2 IMPLANT
LOOP CUTTING BIPOLAR 21FR (ELECTRODE) IMPLANT
MANIFOLD NEPTUNE II (INSTRUMENTS) ×2 IMPLANT
MYOSURE XL FIBROID (MISCELLANEOUS)
PACK VAGINAL MINOR WOMEN LF (CUSTOM PROCEDURE TRAY) ×2 IMPLANT
PAD OB MATERNITY 4.3X12.25 (PERSONAL CARE ITEMS) ×2 IMPLANT
PAD PREP 24X48 CUFFED NSTRL (MISCELLANEOUS) ×2 IMPLANT
SEAL CERVICAL OMNI LOK (ABLATOR) IMPLANT
SEAL ROD LENS SCOPE MYOSURE (ABLATOR) ×2 IMPLANT
SYSTEM TISS REMOVAL MYOSURE XL (MISCELLANEOUS) IMPLANT
WATER STERILE IRR 500ML POUR (IV SOLUTION) IMPLANT

## 2020-03-20 NOTE — Transfer of Care (Signed)
Immediate Anesthesia Transfer of Care Note  Patient: Greig Castilla  Procedure(s) Performed: DILATATION & CURETTAGE/HYSTEROSCOPY WITH MYOSURE (SMALL SCOPE) (N/A Vagina )  Patient Location: PACU  Anesthesia Type:General  Level of Consciousness: drowsy and patient cooperative  Airway & Oxygen Therapy: Patient Spontanous Breathing and Patient connected to nasal cannula oxygen  Post-op Assessment: Report given to RN and Post -op Vital signs reviewed and stable  Post vital signs: Reviewed and stable  Last Vitals:  Vitals Value Taken Time  BP 129/77 03/20/20 0824  Temp 36.3 C 03/20/20 0824  Pulse 93 03/20/20 0826  Resp 18 03/20/20 0826  SpO2 100 % 03/20/20 0826  Vitals shown include unvalidated device data.  Last Pain:  Vitals:   03/20/20 0559  TempSrc: Oral  PainSc: 3       Patients Stated Pain Goal: 5 (67/20/94 7096)  Complications: No complications documented.

## 2020-03-20 NOTE — Discharge Instructions (Signed)
DISCHARGE INSTRUCTIONS: D&C / D&E The following instructions have been prepared to help you care for yourself upon your return home.   Personal hygiene: Marland Kitchen Use sanitary pads for vaginal drainage, not tampons. . Shower the day after your procedure. . NO tub baths, pools or Jacuzzis for 2-3 weeks. . Wipe front to back after using the bathroom.  Activity and limitations: . Do NOT drive or operate any equipment for 24 hours. The effects of anesthesia are still present and drowsiness may result. . Do NOT rest in bed all day. . Walking is encouraged. . Walk up and down stairs slowly. . You may resume your normal activity in one to two days or as indicated by your physician.  Sexual activity: NO intercourse for at least 2 weeks after the procedure, or as indicated by your physician.  Diet: Eat a light meal as desired this evening. You may resume your usual diet tomorrow.  Return to work: You may resume your work activities in one to two days or as indicated by your doctor.  What to expect after your surgery: Expect to have vaginal bleeding/discharge for 2-3 days and spotting for up to 10 days. It is not unusual to have soreness for up to 1-2 weeks. You may have a slight burning sensation when you urinate for the first day. Mild cramps may continue for a couple of days. You may have a regular period in 2-6 weeks.  Call your doctor for any of the following: . Excessive vaginal bleeding, saturating and changing one pad every hour. . Inability to urinate 6 hours after discharge from hospital. . Pain not relieved by pain medication. . Fever of 100.4 F or greater. . Unusual vaginal discharge or odor.   Post Anesthesia Home Care Instructions  Activity: Get plenty of rest for the remainder of the day. A responsible individual must stay with you for 24 hours following the procedure.  For the next 24 hours, DO NOT: -Drive a car -Paediatric nurse -Drink alcoholic beverages -Take any medication  unless instructed by your physician -Make any legal decisions or sign important papers.  Meals: Start with liquid foods such as gelatin or soup. Progress to regular foods as tolerated. Avoid greasy, spicy, heavy foods. If nausea and/or vomiting occur, drink only clear liquids until the nausea and/or vomiting subsides. Call your physician if vomiting continues.  Special Instructions/Symptoms: Your throat may feel dry or sore from the anesthesia or the breathing tube placed in your throat during surgery. If this causes discomfort, gargle with warm salt water. The discomfort should disappear within 24 hours.    No additional Tylenol/acetaminophen until after 12:00 pm today if needed.

## 2020-03-20 NOTE — Op Note (Signed)
Pre op dx: postmenopausal bleeding, cervical stenosis  Post op dx: same  Procedure: paracervical block, Hysteroscopy, D&C  Surgeon:  [name], MD  EBL:  minimal  Specimen: EMC  Anesthesia: general  Complications:  none  Condition:  Pt was taken to the OR after informed consent.  Anesthesia was given and she was placed in dorsal lithotomy position.  Prepped and draped in sterile condition.  Bivalve speculum placed in vaginal and single tooth tenaculum placed on anterior lip of cervix. 7cc lidocaine used for paracervical block. Cervix could not be dilated with pratt dilators.  Diagnostic hysteroscope inserted and used to get through thin cervical adhesions.  survey of intrauterine cavity performed. Clear fluid and filmy adhesions noted in cavity.  Small round mucus beads noted in cavity.  Myosure used to do some directed biopsies.     Hysteroscope removed and a gentle curetting performed.  Specimen placed on telfa and passed off to be sent to pathology.  Sponge, needle, and instrument counts correct.

## 2020-03-20 NOTE — Anesthesia Procedure Notes (Signed)
Procedure Name: LMA Insertion Date/Time: 03/20/2020 7:38 AM Performed by: Gwyndolyn Saxon, CRNA Pre-anesthesia Checklist: Patient identified, Emergency Drugs available, Suction available and Patient being monitored Patient Re-evaluated:Patient Re-evaluated prior to induction Oxygen Delivery Method: Circle system utilized Preoxygenation: Pre-oxygenation with 100% oxygen Induction Type: IV induction Ventilation: Mask ventilation without difficulty LMA: LMA inserted LMA Size: 4.0 Number of attempts: 1 Placement Confirmation: positive ETCO2 and breath sounds checked- equal and bilateral Tube secured with: Tape Dental Injury: Teeth and Oropharynx as per pre-operative assessment

## 2020-03-20 NOTE — Anesthesia Postprocedure Evaluation (Signed)
Anesthesia Post Note  Patient: Greig Castilla  Procedure(s) Performed: DILATATION & CURETTAGE/HYSTEROSCOPY WITH MYOSURE (SMALL SCOPE) (N/A Vagina )     Patient location during evaluation: PACU Anesthesia Type: General Level of consciousness: awake and alert, patient cooperative and oriented Pain management: pain level controlled Vital Signs Assessment: post-procedure vital signs reviewed and stable Respiratory status: spontaneous breathing, nonlabored ventilation and respiratory function stable Cardiovascular status: blood pressure returned to baseline and stable Postop Assessment: no apparent nausea or vomiting and able to ambulate Anesthetic complications: no   No complications documented.  Last Vitals:  Vitals:   03/20/20 0559 03/20/20 0824  BP: (!) 152/96 129/77  Pulse: 99 99  Resp: 20 17  Temp: 36.9 C (!) 36.3 C  SpO2: 100% 100%    Last Pain:  Vitals:   03/20/20 0824  TempSrc:   PainSc: 0-No pain                 JACKSON,E. CARSWELL

## 2020-03-23 ENCOUNTER — Encounter (HOSPITAL_BASED_OUTPATIENT_CLINIC_OR_DEPARTMENT_OTHER): Payer: Self-pay | Admitting: Obstetrics and Gynecology

## 2020-03-23 LAB — SURGICAL PATHOLOGY

## 2020-03-31 DIAGNOSIS — N760 Acute vaginitis: Secondary | ICD-10-CM | POA: Diagnosis not present

## 2020-03-31 DIAGNOSIS — Z09 Encounter for follow-up examination after completed treatment for conditions other than malignant neoplasm: Secondary | ICD-10-CM | POA: Diagnosis not present

## 2020-03-31 NOTE — Progress Notes (Signed)
z

## 2020-04-01 ENCOUNTER — Inpatient Hospital Stay (HOSPITAL_BASED_OUTPATIENT_CLINIC_OR_DEPARTMENT_OTHER): Payer: Medicare PPO | Admitting: Oncology

## 2020-04-01 DIAGNOSIS — C50411 Malignant neoplasm of upper-outer quadrant of right female breast: Secondary | ICD-10-CM

## 2020-04-01 DIAGNOSIS — Z170 Estrogen receptor positive status [ER+]: Secondary | ICD-10-CM

## 2020-04-01 DIAGNOSIS — Z9011 Acquired absence of right breast and nipple: Secondary | ICD-10-CM | POA: Diagnosis not present

## 2020-04-01 DIAGNOSIS — Z853 Personal history of malignant neoplasm of breast: Secondary | ICD-10-CM | POA: Diagnosis not present

## 2020-04-01 DIAGNOSIS — M810 Age-related osteoporosis without current pathological fracture: Secondary | ICD-10-CM | POA: Diagnosis not present

## 2020-04-01 NOTE — Progress Notes (Signed)
[facility]  [phone] [phone]     ID: Breanna Nelson DOB: 1950-10-13  MR#: [id number]  CSN#:[id number]  Patient Care Team: [name], MD as PCP - General (Internal Medicine) Glennette Galster, [name], MD as Consulting Physician (Oncology) [name], MD as Consulting Physician (General Surgery) [name], MD as Consulting Physician (Radiation Oncology) [name], MD as Consulting Physician (Obstetrics and Gynecology) OTHER MD: Dr [name], surgery  I connected with Breanna Nelson on 04/01/20 at  2:15 PM EDT by video enabled telemedicine visit and verified that I am speaking with the correct person using two identifiers.   I discussed the limitations, risks, security and privacy concerns of performing an evaluation and management service by telemedicine and the availability of in-person appointments. I also discussed with the patient that there may be a patient responsible charge related to this service. The patient expressed understanding and agreed to proceed.   Other persons participating in the visit and their role in the encounter: Patient's daughter  Patient's location: Home Provider's location: [place]  Total time spent: 20 minutes*   CHIEF COMPLAINT: Estrogen receptor positive breast cancer (s/p right mastectomy)  CURRENT TREATMENT: Completing 6-1/2 years tamoxifen   INTERVAL HISTORY: "Breanna Nelson" was contacted today for follow-up of her estrogen receptor positive breast cancer.   She has been on tamoxifen and generally did well with that, but did develop postmenopausal bleeding.  She brought this to her primary care physician and was referred to Dr. [name] who did a Pap smear.    She next underwent D&C on 03/20/2020 under Dr. Julien Girt. Pathology from the procedure [id number]) showed a uterine polyp, no malignancy.   REVIEW OF SYSTEMS: Breanna Nelson tells me she is doing fine, has not noted any  significant difference since going off the tamoxifen.  She really was not having significant issues with hot flashes prior to that.  She is having perhaps a little bit less of a vaginal wetness at this time.  Otherwise a detailed review of systems today was noncontributory   COVID 19 VACCINATION STATUS:      BREAST CANCER HISTORY: From the original intake note:  The patient herself noted a lump in her right breast late April 20 15th. She brought it to her primary care physician's attention and on 05/06/2013 had a bilateral mammography with tomography at [facility]. The breast density was category C. There was a new area of architectural distortion at the 11:00 position of the right breast with no other significant findings. Accordingly a right breast ultrasound was performed the same day. This identified a 1 cm irregular mass in the right breast at the 10:00 position. This correlated well with the palpable findings and biopsy of the mass was obtained the same day. This showed ([id number]) and invasive mammary carcinoma, with strong diffuse expression of E-cadherin both an invasive and in situ carcinomas, supporting a ductal etiology. Estrogen receptor was 100% positive, progesterone receptor was 96% positive, with strong staining intensity, the MIB-1 was 3%, and there was no HER-2 amplification, the signals ratio being 1.3 and the number per cell 2.5.  The patient underwent bilateral breast MRI 05/13/2013. This confirmed, in the upper outer quadrant of the right breast, several small nodules, associated with an area of postbiopsy hematoma. Taking together the area of enhancement and hematoma the largest measuring was 1.8 cm. There were no masses or abnormal enhancement seen in the left breast and no suspicious lymph nodes.  The patient  was seen in the multidisciplinary clinic by surgery and radiation oncology 05/15/2013. After discussion here and also with Dr. [name], and with a good understanding of the  equivalent results between mastectomy and breast conservation surgery plus radiation, the patient opted for mastectomy (without planned reconstruction) and sentinel lymph node sampling. This was performed 06/13/2013 at [facility]. There was some postoperative bleeding requiring evacuation of a hematoma. The final pathology ([id number]) showed a 1.5 cm invasive mammary carcinoma (either at lobular breast cancer with the unusual feature of being E-cadherin positive, or a ductal breast cancer with lobular features), grade 2, with a positive intramammary lymph node. The sentinel lymph node and 4 additional axillary lymph nodes were negative. The tumor was again strongly estrogen and progesterone receptor positive, again HER-2 negative by FISH, with a low MIB-1-1, not read as 10%. Margins were negative.  On 07/03/2013 the patient was reevaluated by radiation oncology at they agreed that postmastectomy radiation was not indicated. They suggested consultation to medical oncology to consider adjuvant hormonal therapy.  The patient's subsequent history is as detailed below.   PAST MEDICAL HISTORY: Past Medical History:  Diagnosis Date  . Anxiety   . Breast cancer of upper-outer quadrant of right female breast ([place]) dx'd 04/2013   mastectomy with lymph node removed,   . Hypertension   . Osteoporosis   . PMB (postmenopausal bleeding)    last 3 months  . Wears glasses    for reading    PAST SURGICAL HISTORY: Past Surgical History:  Procedure Laterality Date  . DILATATION & CURETTAGE/HYSTEROSCOPY WITH MYOSURE N/A 03/20/2020   Procedure: DILATATION & CURETTAGE/HYSTEROSCOPY WITH MYOSURE (SMALL SCOPE);  Surgeon: [name], MD;  Location: [facility];  Service: Gynecology;  Laterality: N/A;  . SIMPLE MASTECTOMY W/ SENTINEL NODE BIOPSY Right 06/13/13   by Dr. [name]    FAMILY HISTORY Family History  Problem Relation Age of Onset  . Skin cancer Mother   . Testicular  cancer Brother    the patient's father died at the age of 16. The patient's mother died at the age of 52 from heart disease. She also had Alzheimer's disease. The patient had one brother who died in the age of 4 with testicular cancer. One sister died as a baby. There is no history of breast or ovarian cancer in the family.   GYNECOLOGIC HISTORY:  No LMP recorded. Patient is postmenopausal. Menarche age 16, first live birth age 59. The patient is GX P2. She does not recall when she went through menopause, but he was more than 10 years ago. She never took hormone replacement. She did take birth control pills for a brief period, with no complications   SOCIAL HISTORY:  Breanna Nelson works at [employer]. At home it's just she and her dog [name]. Her husband died from complications of lymphoma. He also had undergone a colectomy in 2004. "He had more than 200 polyps". The patient's daughter [name] lives in [place] where she works as a Engineer, structural. She also does [employer]. Her son Sharrie Self is a truck driver.  He recently underwent colonoscopy.  He lives near [place], in [place].  The patient has 4 grandchildren. The patient is a [employer].--[name] tells me she is fianced to [name], who is a church member with her.  Unfortunately he has significant COPD problems and is on oxygen.  That is why she was tested for coronavirus earlier this year.    ADVANCED DIRECTIVES: Not in place;  the patient intends to name her daughter [name] as her healthcare power of attorney.   HEALTH MAINTENANCE: Social History   Tobacco Use  . Smoking status: Never Smoker  . Smokeless tobacco: Never Used  Vaping Use  . Vaping Use: Never used  Substance Use Topics  . Alcohol use: No  . Drug use: Never     Colonoscopy:  PAP:  Bone density:   Lipid panel:  No Known Allergies  Current Outpatient Medications  Medication Sig Dispense Refill  . amLODipine (NORVASC) 5 MG tablet every evening.     . Calcium 1200-1000 MG-UNIT CHEW Chew 1 capsule by mouth daily.    . Cholecalciferol (VITAMIN D-3) 1000 UNITS CAPS Take 1 capsule by mouth daily.    Marland Kitchen ibuprofen (ADVIL) 600 MG tablet Take 1 tablet (600 mg total) by mouth every 6 (six) hours as needed for moderate pain. 30 tablet 0  . Multiple Vitamin (MULTIVITAMIN) tablet Take 1 tablet by mouth daily.    . vitamin C (ASCORBIC ACID) 500 MG tablet Take 1,000 mg by mouth daily.     No current facility-administered medications for this visit.    OBJECTIVE: White woman   There were no vitals filed for this visit.   There is no height or weight on file to calculate BMI.    ECOG FS:1 - Symptomatic but completely ambulatory  Telemedicine visit 04/01/2020  LAB RESULTS:  CMP     Component Value Date/Time   NA 140 03/18/2020 1329   NA 139 09/12/2016 1235   K 4.9 03/18/2020 1329   K 3.5 09/12/2016 1235   CL 103 03/18/2020 1329   CO2 23 03/18/2020 1329   CO2 28 09/12/2016 1235   GLUCOSE 100 (H) 03/18/2020 1329   GLUCOSE 118 09/12/2016 1235   BUN 13 03/18/2020 1329   BUN 11.9 09/12/2016 1235   CREATININE 0.63 03/18/2020 1329   CREATININE 0.77 03/03/2020 1424   CREATININE 0.8 09/12/2016 1235   CALCIUM 9.2 03/18/2020 1329   CALCIUM 8.9 09/12/2016 1235   PROT 6.4 (L) 03/03/2020 1424   PROT 6.4 09/12/2016 1235   ALBUMIN 3.8 03/03/2020 1424   ALBUMIN 3.6 09/12/2016 1235   AST 30 03/03/2020 1424   AST 25 09/12/2016 1235   ALT 22 03/03/2020 1424   ALT 15 09/12/2016 1235   ALKPHOS 46 03/03/2020 1424   ALKPHOS 53 09/12/2016 1235   BILITOT 0.7 03/03/2020 1424   BILITOT 0.32 09/12/2016 1235   GFRNONAA >60 03/18/2020 1329   GFRNONAA >60 03/03/2020 1424   GFRAA >60 09/27/2019 1330    No results found for: TOTALPROTELP, ALBUMINELP, A1GS, A2GS, BETS, BETA2SER, GAMS, MSPIKE, SPEI  Lab Results  Component Value Date   WBC 6.6 03/18/2020   NEUTROABS 4.6 03/03/2020   HGB 14.3 03/18/2020   HCT 42.7 03/18/2020   MCV 96.4 03/18/2020   PLT  213 03/18/2020    No results found for: LABCA2  No components found for: OTLXBW620  No results for input(s): INR in the last 168 hours.  No results found for: LABCA2  No results found for: BTD974  No results found for: BUL845  No results found for: XMI680  No results found for: CA2729  No components found for: HGQUANT  No results found for: CEA1 / No results found for: CEA1   No results found for: AFPTUMOR  No results found for: CHROMOGRNA  No results found for: KPAFRELGTCHN, LAMBDASER, KAPLAMBRATIO (kappa/lambda light chains)  No results found for: HGBA, HGBA2QUANT, HGBFQUANT, HGBSQUAN (Hemoglobinopathy  evaluation)   No results found for: LDH  No results found for: IRON, TIBC, IRONPCTSAT (Iron and TIBC)  Lab Results  Component Value Date   FERRITIN 19 09/07/2017    Urinalysis No results found for: COLORURINE, APPEARANCEUR, LABSPEC, PHURINE, GLUCOSEU, HGBUR, BILIRUBINUR, KETONESUR, PROTEINUR, UROBILINOGEN, NITRITE, LEUKOCYTESUR   STUDIES: No results found.   ASSESSMENT: 70 y.o. [place] woman status post right mastectomy and sentinel lymph node sampling 06/13/2013 for an upper outer quadrant pT2 pN1, stage IIB invasive mammary carcinoma (either an invasive ductal carcinoma with lobular features, or a lobular carcinoma that was E-cadherin strongly positive), estrogen and progesterone receptor positive, HER-2 nonamplified, with a low MIB-1  (1) there was no indication for postmastectomy radiation  (2) the patient opted against adjuvant chemotherapy  (3) tamoxifen started September 2015, discontinued February 2022  (4) osteoporosis by bone density scan 09/18/2009  (5) questionable finding right rib cage:  (a) CT of the chest with contrast 08/17/2017 obtained to evaluate chest wall pain on the right was benign  (b) chest x-ray and right rib films on 10/01/2018 following a fall documented a healed fracture in the lateral right fourth rib no bone lesions  otherwise  (c) bone scan 01/13/2020 obtained for evaluation of right rib pain showed questionable uptake at the posterior right eighth rib.  (d) chest x-ray 01/28/2020 showed degenerative changes of the thoracic spine  (e) review of all the above films by radiology (Dr. Thornton Papas) 03/03/2020 finds no definite healing fracture on the earlier films to explain the right eighth rib scintigraphic finding which is described as of questionable etiology and significance.  (f) repeat bone scan scheduled for August 2022  PLAN: Breanna Nelson is now just about 7 years out from definitive surgery for her breast cancer with no evidence of disease recurrence.  This is very favorable.  She has a history of right-sided discomfort which has led to multiple films as listed above.  The most likely explanation of that of course is the prior surgery on that side with remodeling from scar tissue.  I would not be uncomfortable releasing her to her primary care physician but we still have the issue of the finding on bone scan in January.  The family not unreasonably desires resolution.  After much discussion we decided to repeat a bone scan in August of this year.  I will follow up with a visit after that and that may be [employer] "graduation" visit.  Total encounter time 20 minutes.Sarajane Jews C. Jaiya Mooradian, MD 04/01/20 4:00 PM Medical Oncology and Hematology [facility] [place], [place] [address] Tel. [phone]    Fax. [phone]   I, Wilburn Mylar, am acting as scribe for Dr. [name]. Azari Hasler.  I, Lurline Del MD, have reviewed the above documentation for accuracy and completeness, and I agree with the above.   *Total Encounter Time as defined by the Centers for Medicare and Medicaid Services includes, in addition to the face-to-face time of a patient visit (documented in the note above) non-face-to-face time: obtaining and reviewing outside history, ordering and reviewing  medications, tests or procedures, care coordination (communications with other health care professionals or caregivers) and documentation in the medical record.

## 2020-04-03 ENCOUNTER — Telehealth: Payer: Self-pay | Admitting: Oncology

## 2020-04-03 NOTE — Telephone Encounter (Signed)
Scheduled appt per 3/31 los. Pt aware.

## 2020-04-16 DIAGNOSIS — L218 Other seborrheic dermatitis: Secondary | ICD-10-CM | POA: Diagnosis not present

## 2020-04-16 DIAGNOSIS — D1801 Hemangioma of skin and subcutaneous tissue: Secondary | ICD-10-CM | POA: Diagnosis not present

## 2020-04-16 DIAGNOSIS — L821 Other seborrheic keratosis: Secondary | ICD-10-CM | POA: Diagnosis not present

## 2020-04-16 DIAGNOSIS — D225 Melanocytic nevi of trunk: Secondary | ICD-10-CM | POA: Diagnosis not present

## 2020-04-16 DIAGNOSIS — L814 Other melanin hyperpigmentation: Secondary | ICD-10-CM | POA: Diagnosis not present

## 2020-05-22 DIAGNOSIS — I10 Essential (primary) hypertension: Secondary | ICD-10-CM | POA: Diagnosis not present

## 2020-05-27 ENCOUNTER — Encounter: Payer: Self-pay | Admitting: Oncology

## 2020-06-02 DIAGNOSIS — M810 Age-related osteoporosis without current pathological fracture: Secondary | ICD-10-CM | POA: Diagnosis not present

## 2020-06-02 DIAGNOSIS — R079 Chest pain, unspecified: Secondary | ICD-10-CM | POA: Diagnosis not present

## 2020-06-02 DIAGNOSIS — Z0000 Encounter for general adult medical examination without abnormal findings: Secondary | ICD-10-CM | POA: Diagnosis not present

## 2020-06-02 DIAGNOSIS — I10 Essential (primary) hypertension: Secondary | ICD-10-CM | POA: Diagnosis not present

## 2020-06-03 ENCOUNTER — Other Ambulatory Visit: Payer: Self-pay | Admitting: Internal Medicine

## 2020-06-03 DIAGNOSIS — I10 Essential (primary) hypertension: Secondary | ICD-10-CM

## 2020-06-05 DIAGNOSIS — N760 Acute vaginitis: Secondary | ICD-10-CM | POA: Diagnosis not present

## 2020-06-26 ENCOUNTER — Other Ambulatory Visit: Payer: Self-pay | Admitting: Internal Medicine

## 2020-06-26 DIAGNOSIS — I10 Essential (primary) hypertension: Secondary | ICD-10-CM

## 2020-07-09 ENCOUNTER — Telehealth: Payer: Self-pay | Admitting: Oncology

## 2020-07-09 NOTE — Telephone Encounter (Signed)
Rescheduled appointment per provider. Left message. 

## 2020-07-16 ENCOUNTER — Ambulatory Visit
Admission: RE | Admit: 2020-07-16 | Discharge: 2020-07-16 | Disposition: A | Discharge status: Home / Self Care | Payer: No Typology Code available for payment source | Source: Ambulatory Visit | Attending: Internal Medicine | Admitting: Internal Medicine

## 2020-07-16 DIAGNOSIS — I10 Essential (primary) hypertension: Secondary | ICD-10-CM

## 2020-08-06 DIAGNOSIS — I2584 Coronary atherosclerosis due to calcified coronary lesion: Secondary | ICD-10-CM | POA: Diagnosis not present

## 2020-08-06 DIAGNOSIS — I2510 Atherosclerotic heart disease of native coronary artery without angina pectoris: Secondary | ICD-10-CM | POA: Diagnosis not present

## 2020-08-13 ENCOUNTER — Other Ambulatory Visit: Payer: Self-pay

## 2020-08-13 ENCOUNTER — Encounter (HOSPITAL_COMMUNITY)
Admission: RE | Admit: 2020-08-13 | Discharge: 2020-08-13 | Disposition: A | Discharge status: Home / Self Care | Payer: Medicare PPO | Source: Ambulatory Visit | Attending: Oncology | Admitting: Oncology

## 2020-08-13 DIAGNOSIS — Z170 Estrogen receptor positive status [ER+]: Secondary | ICD-10-CM | POA: Diagnosis not present

## 2020-08-13 DIAGNOSIS — C50411 Malignant neoplasm of upper-outer quadrant of right female breast: Secondary | ICD-10-CM | POA: Diagnosis not present

## 2020-08-13 DIAGNOSIS — C50919 Malignant neoplasm of unspecified site of unspecified female breast: Secondary | ICD-10-CM | POA: Diagnosis not present

## 2020-08-13 DIAGNOSIS — N644 Mastodynia: Secondary | ICD-10-CM | POA: Diagnosis not present

## 2020-08-13 MED ORDER — TECHNETIUM TC 99M MEDRONATE IV KIT
19.6000 | PACK | Freq: Once | INTRAVENOUS | Status: AC
  Administered 2020-08-13: 19.6 via INTRAVENOUS

## 2020-08-17 ENCOUNTER — Other Ambulatory Visit: Payer: Self-pay | Admitting: Oncology

## 2020-08-17 ENCOUNTER — Encounter: Payer: Self-pay | Admitting: Oncology

## 2020-08-21 DIAGNOSIS — Z1231 Encounter for screening mammogram for malignant neoplasm of breast: Secondary | ICD-10-CM | POA: Diagnosis not present

## 2020-09-01 ENCOUNTER — Ambulatory Visit: Payer: Medicare PPO | Admitting: Oncology

## 2020-09-07 NOTE — Progress Notes (Signed)
[place]  [phone] [phone]     ID: Breanna Nelson DOB: 05/01/1950  MR#: [id number]  CSN#:[id number]  Patient Care Team: [name], MD as PCP - General (Internal Medicine) Lory Nowaczyk, [name], MD as Consulting Physician (Oncology) [name], MD as Consulting Physician (General Surgery) [name], MD as Consulting Physician (Radiation Oncology) [name], MD as Consulting Physician (Obstetrics and Gynecology) OTHER MD: Dr [name], surgery   CHIEF COMPLAINT: Estrogen receptor positive breast cancer (s/p right mastectomy)  CURRENT TREATMENT: Observation   INTERVAL HISTORY: "Breanna Nelson" returns today for follow-up of her estrogen receptor positive breast cancer. She is now under observation.  Since her last visit, she underwent repeat bone scan on 08/13/2020 showing: no evidence of osseous metastatic disease; previously questionable uptake over right posterior rib is not visualized.   REVIEW OF SYSTEMS: Breanna Nelson continues to be anxious about breast cancer recurrence.  She tells me she has back pain by which she means she has pain in the rib cage area on the right flank area.  She has pains that come and go in the left breast.  She has discomfort in the right axilla.  None of these are actually new. When we go back and review prior notes and prior tests she has been extensively evaluated for all this.  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   COVID 19 VACCINATION STATUS:      BREAST CANCER HISTORY: From the original intake note:  The patient herself noted a lump in her right breast late April 20 15th. She brought it to her primary care physician's attention and on 05/06/2013 had a bilateral mammography with tomography at [facility]. The breast density was category C. There was a new area of architectural distortion at the 11:00 position of the right breast with no other  significant findings. Accordingly a right breast ultrasound was performed the same day. This identified a 1 cm irregular mass in the right breast at the 10:00 position. This correlated well with the palpable findings and biopsy of the mass was obtained the same day. This showed ([id number]) and invasive mammary carcinoma, with strong diffuse expression of E-cadherin both an invasive and in situ carcinomas, supporting a ductal etiology. Estrogen receptor was 100% positive, progesterone receptor was 96% positive, with strong staining intensity, the MIB-1 was 3%, and there was no HER-2 amplification, the signals ratio being 1.3 and the number per cell 2.5.  The patient underwent bilateral breast MRI 05/13/2013. This confirmed, in the upper outer quadrant of the right breast, several small nodules, associated with an area of postbiopsy hematoma. Taking together the area of enhancement and hematoma the largest measuring was 1.8 cm. There were no masses or abnormal enhancement seen in the left breast and no suspicious lymph nodes.  The patient was seen in the multidisciplinary clinic by surgery and radiation oncology 05/15/2013. After discussion here and also with Dr. [name], and with a good understanding of the equivalent results between mastectomy and breast conservation surgery plus radiation, the patient opted for mastectomy (without planned reconstruction) and sentinel lymph node sampling. This was performed 06/13/2013 at [facility]. There was some postoperative bleeding requiring evacuation of a hematoma. The final pathology ([id number]) showed a 1.5 cm invasive mammary carcinoma (either at lobular breast cancer with the unusual feature of being E-cadherin positive, or a ductal breast cancer with lobular features), grade 2, with a positive  intramammary lymph node. The sentinel lymph node and 4 additional axillary lymph nodes were negative. The tumor was again strongly estrogen and progesterone receptor  positive, again HER-2 negative by FISH, with a low MIB-1-1, not read as 10%. Margins were negative.  On 07/03/2013 the patient was reevaluated by radiation oncology at they agreed that postmastectomy radiation was not indicated. They suggested consultation to medical oncology to consider adjuvant hormonal therapy.  The patient's subsequent history is as detailed below.   PAST MEDICAL HISTORY: Past Medical History:  Diagnosis Date   Anxiety    Breast cancer of upper-outer quadrant of right female breast ([place]) dx'd 04/2013   mastectomy with lymph node removed,    Hypertension    Osteoporosis    PMB (postmenopausal bleeding)    last 3 months   Wears glasses    for reading    PAST SURGICAL HISTORY: Past Surgical History:  Procedure Laterality Date   DILATATION & CURETTAGE/HYSTEROSCOPY WITH MYOSURE N/A 03/20/2020   Procedure: DILATATION & CURETTAGE/HYSTEROSCOPY WITH MYOSURE (SMALL SCOPE);  Surgeon: [name], MD;  Location: [facility];  Service: Gynecology;  Laterality: N/A;   SIMPLE MASTECTOMY W/ SENTINEL NODE BIOPSY Right 06/13/13   by Dr. [name]    FAMILY HISTORY Family History  Problem Relation Age of Onset   Skin cancer Mother    Testicular cancer Brother    the patient's father died at the age of 26. The patient's mother died at the age of 36 from heart disease. She also had Alzheimer's disease. The patient had one brother who died in the age of 51 with testicular cancer. One sister died as a baby. There is no history of breast or ovarian cancer in the family.   GYNECOLOGIC HISTORY:  No LMP recorded. Patient is postmenopausal. Menarche age 77, first live birth age 70. The patient is GX P2. She does not recall when she went through menopause, but he was more than 10 years ago. She never took hormone replacement. She did take birth control pills for a brief period, with no complications   SOCIAL HISTORY:  Breanna Nelson works at [employer]. At home  it's just she and her dog [name]. Her husband died from complications of lymphoma. He also had undergone a colectomy in 2004. "He had more than 200 polyps". The patient's daughter [name] lives in [place] where she works as a Engineer, structural. She also does [employer]. Her son Analaya Hoey is a truck driver.  He recently underwent colonoscopy.  He lives near [place], in [place].  The patient has 4 grandchildren. The patient is a [employer].--Breanna Nelson, who is a church member with her.  Unfortunately he has significant COPD problems and is on oxygen.  That is why she was tested for coronavirus earlier this year.    ADVANCED DIRECTIVES: Not in place; the patient intends to name her daughter [name] as her healthcare power of attorney.   HEALTH MAINTENANCE: Social History   Tobacco Use   Smoking status: Never   Smokeless tobacco: Never  Vaping Use   Vaping Use: Never used  Substance Use Topics   Alcohol use: No   Drug use: Never     Colonoscopy:  PAP:  Bone density:   Lipid panel:  No Known Allergies  Current Outpatient Medications  Medication Sig Dispense Refill   rosuvastatin (CRESTOR) 5 MG tablet Take 1 tablet (5 mg total) by mouth daily.  amLODipine (NORVASC) 5 MG tablet every evening.     Calcium 1200-1000 MG-UNIT CHEW Chew 1 capsule by mouth daily.     Cholecalciferol (VITAMIN D-3) 1000 UNITS CAPS Take 1 capsule by mouth daily.     ibuprofen (ADVIL) 600 MG tablet Take 1 tablet (600 mg total) by mouth every 6 (six) hours as needed for moderate pain. 30 tablet 0   Multiple Vitamin (MULTIVITAMIN) tablet Take 1 tablet by mouth daily.     vitamin C (ASCORBIC ACID) 500 MG tablet Take 1,000 mg by mouth daily.     No current facility-administered medications for this visit.    OBJECTIVE: White Nelson who appears stated age  70:   09/08/20 1110  BP: (!) 139/94  Pulse: 87  Temp: 98.1 F (36.7 C)  SpO2: 99%     Body mass  index is 18.95 kg/m.    ECOG FS:1 - Symptomatic but completely ambulatory  Sclerae unicteric, EOMs intact Wearing a mask No cervical or supraclavicular adenopathy Lungs no rales or rhonchi Heart regular rate and rhythm Abd soft, nontender, positive bowel sounds MSK no focal spinal tenderness, no upper extremity lymphedema Neuro: nonfocal, well oriented, appropriate affect Breasts: The right breast is status postmastectomy.  The chest wall shows no evidence of local recurrence.  The right axilla is quite benign there is no palpable adenopathy not even much fat at all.  That makes the exam more sensitive.  Left breast and left axilla are benign.   LAB RESULTS:  CMP     Component Value Date/Time   NA 140 03/18/2020 1329   NA 139 09/12/2016 1235   K 4.9 03/18/2020 1329   K 3.5 09/12/2016 1235   CL 103 03/18/2020 1329   CO2 23 03/18/2020 1329   CO2 28 09/12/2016 1235   GLUCOSE 100 (H) 03/18/2020 1329   GLUCOSE 118 09/12/2016 1235   BUN 13 03/18/2020 1329   BUN 11.9 09/12/2016 1235   CREATININE 0.63 03/18/2020 1329   CREATININE 0.77 03/03/2020 1424   CREATININE 0.8 09/12/2016 1235   CALCIUM 9.2 03/18/2020 1329   CALCIUM 8.9 09/12/2016 1235   PROT 6.4 (L) 03/03/2020 1424   PROT 6.4 09/12/2016 1235   ALBUMIN 3.8 03/03/2020 1424   ALBUMIN 3.6 09/12/2016 1235   AST 30 03/03/2020 1424   AST 25 09/12/2016 1235   ALT 22 03/03/2020 1424   ALT 15 09/12/2016 1235   ALKPHOS 46 03/03/2020 1424   ALKPHOS 53 09/12/2016 1235   BILITOT 0.7 03/03/2020 1424   BILITOT 0.32 09/12/2016 1235   GFRNONAA >60 03/18/2020 1329   GFRNONAA >60 03/03/2020 1424   GFRAA >60 09/27/2019 1330    No results found for: TOTALPROTELP, ALBUMINELP, A1GS, A2GS, BETS, BETA2SER, GAMS, MSPIKE, SPEI  Lab Results  Component Value Date   WBC 6.6 03/18/2020   NEUTROABS 4.6 03/03/2020   HGB 14.3 03/18/2020   HCT 42.7 03/18/2020   MCV 96.4 03/18/2020   PLT 213 03/18/2020    No results found for: LABCA2  No  components found for: WSFKCL275  No results for input(s): INR in the last 168 hours.  No results found for: LABCA2  No results found for: TZG017  No results found for: CBS496  No results found for: PRF163  No results found for: CA2729  No components found for: HGQUANT  No results found for: CEA1 / No results found for: CEA1   No results found for: AFPTUMOR  No results found for: [place]  No results found for: KPAFRELGTCHN,  LAMBDASER, KAPLAMBRATIO (kappa/lambda light chains)  No results found for: HGBA, HGBA2QUANT, HGBFQUANT, HGBSQUAN (Hemoglobinopathy evaluation)   No results found for: LDH  No results found for: IRON, TIBC, IRONPCTSAT (Iron and TIBC)  Lab Results  Component Value Date   FERRITIN 19 09/07/2017    Urinalysis No results found for: COLORURINE, APPEARANCEUR, LABSPEC, PHURINE, GLUCOSEU, HGBUR, BILIRUBINUR, KETONESUR, PROTEINUR, UROBILINOGEN, NITRITE, LEUKOCYTESUR   STUDIES: NM Bone Scan Whole Body  Result Date: 08/15/2020 CLINICAL DATA:  Breast cancer staging, follow-up questionable lesion on previous bone scan. Right posterior rib pain and left breast pain since March of 2022. EXAM: NUCLEAR MEDICINE WHOLE BODY BONE SCAN TECHNIQUE: Whole body anterior and posterior images were obtained approximately 3 hours after intravenous injection of radiopharmaceutical. RADIOPHARMACEUTICALS:  19.6 mCi Technetium-27mMDP IV COMPARISON:  01/13/2020 FINDINGS: There is normal distribution of radiotracer within the bones, soft tissues and genitourinary system. Examination demonstrates no focal obtained over the posterior right eighth rib as the previously identified questionable uptake in this area is less apparent on the current exam. No evidence of osseous metastatic disease. Minimal degenerate changes over the appendicular skeleton. Subtle stable biphasic curvature of the thoracolumbar spine. IMPRESSION: No evidence of osseous metastatic disease. Previously questionable  uptake over the right posterior rib is not visualized. Electronically Signed   By: DMarin OlpM.D.   On: 08/15/2020 08:17     ASSESSMENT: 70y.o. Breanna Nelson status post right mastectomy and sentinel lymph node sampling 06/13/2013 for an upper outer quadrant pT2 pN1, stage IIB invasive mammary carcinoma (either an invasive ductal carcinoma with lobular features, or a lobular carcinoma that was E-cadherin strongly positive), estrogen and progesterone receptor positive, HER-2 nonamplified, with a low MIB-1  (1) there was no indication for postmastectomy radiation  (2) the patient opted against adjuvant chemotherapy  (3) tamoxifen started September 2015, discontinued February 2022  (4) osteoporosis by bone density scan 09/18/2009  (5) questionable finding right rib cage:  (a) CT of the chest with contrast 08/17/2017 obtained to evaluate chest wall pain on the right was benign  (b) chest x-ray and right rib films on 10/01/2018 following a fall documented a healed fracture in the lateral right fourth rib no bone lesions otherwise  (c) bone scan 01/13/2020 obtained for evaluation of right rib pain showed questionable uptake at the posterior right eighth rib.  (d) chest x-ray 01/28/2020 showed degenerative changes of the thoracic spine  (e) review of all the above films by radiology (Dr. BThornton Papas 03/03/2020 finds no definite healing fracture on the earlier films to explain the right eighth rib scintigraphic finding which is described as of questionable etiology and significance.  (f) repeat bone scan 08/15/2020 finds the previously questionable uptake in the right posterior rib not visualized; there is no evidence of bony metastatic disease   PLAN: EFestus Holtsis now a little over 7 years out from definitive surgery for her breast cancer with no evidence of disease recurrence.  This is very favorable.  She continues to have discomfort in the right axilla, a little bit of soreness sometimes in the  right chest wall, sometimes she has pains in the left breast, and she tends to have a little bit of right flank pain which she calls back pain.  These discomforts are expected after the surgery and treatment that she has had and I went over all her prior studies including the chest CT scan obtained precisely to evaluate this in 2019.  I also reviewed the bone scans with her.  EFestus Nelson  kniows that I would be comfortable releasing her from follow-up here but she tells me that she feels much more comfortable coming once a year having everything looked over again and being reassured that she does not have cancer.  That is precisely why we have a survivorship program and she is an excellent candidate for it.  She understands that she does need to continue to see her primary care physician and that he will be obtaining lab work for her at his discretion.  The lab work we do here generally tends to duplicate that so unless she has not had lab work for a year we very likely will not do lab work with our survivorship visits, which would be yearly and can continue indefinitely  She knows to call for any other issue that may develop before then.  Total encounter time 25 minutes.   [name]. Denajah Farias, MD 09/08/20 11:23 AM Medical Oncology and Hematology [facility] [place], [place] [address] Tel. [phone]    Fax. [phone]   I, Wilburn Mylar, am acting as scribe for Dr. [name]. Chantia Amalfitano.  I, Lurline Del MD, have reviewed the above documentation for accuracy and completeness, and I agree with the above.   *Total Encounter Time as defined by the Centers for Medicare and Medicaid Services includes, in addition to the face-to-face time of a patient visit (documented in the note above) non-face-to-face time: obtaining and reviewing outside history, ordering and reviewing medications, tests or procedures, care coordination (communications with other health care  professionals or caregivers) and documentation in the medical record.

## 2020-09-08 ENCOUNTER — Other Ambulatory Visit: Payer: Self-pay

## 2020-09-08 ENCOUNTER — Inpatient Hospital Stay: Payer: Medicare PPO | Attending: Oncology | Admitting: Oncology

## 2020-09-08 VITALS — BP 139/94 | HR 87 | Temp 98.1°F | Wt 119.2 lb

## 2020-09-08 DIAGNOSIS — Z9011 Acquired absence of right breast and nipple: Secondary | ICD-10-CM | POA: Diagnosis not present

## 2020-09-08 DIAGNOSIS — C50411 Malignant neoplasm of upper-outer quadrant of right female breast: Secondary | ICD-10-CM | POA: Diagnosis not present

## 2020-09-08 DIAGNOSIS — Z79899 Other long term (current) drug therapy: Secondary | ICD-10-CM | POA: Diagnosis not present

## 2020-09-08 DIAGNOSIS — Z853 Personal history of malignant neoplasm of breast: Secondary | ICD-10-CM | POA: Insufficient documentation

## 2020-09-08 DIAGNOSIS — Z170 Estrogen receptor positive status [ER+]: Secondary | ICD-10-CM | POA: Diagnosis not present

## 2020-10-08 ENCOUNTER — Other Ambulatory Visit: Payer: Self-pay

## 2020-10-08 ENCOUNTER — Ambulatory Visit: Payer: Medicare PPO | Admitting: Sports Medicine

## 2020-10-08 ENCOUNTER — Encounter: Payer: Self-pay | Admitting: Sports Medicine

## 2020-10-08 DIAGNOSIS — M21621 Bunionette of right foot: Secondary | ICD-10-CM | POA: Diagnosis not present

## 2020-10-08 DIAGNOSIS — M21622 Bunionette of left foot: Secondary | ICD-10-CM

## 2020-10-08 DIAGNOSIS — M79672 Pain in left foot: Secondary | ICD-10-CM | POA: Diagnosis not present

## 2020-10-08 DIAGNOSIS — M810 Age-related osteoporosis without current pathological fracture: Secondary | ICD-10-CM | POA: Insufficient documentation

## 2020-10-08 DIAGNOSIS — L84 Corns and callosities: Secondary | ICD-10-CM | POA: Diagnosis not present

## 2020-10-08 DIAGNOSIS — Z853 Personal history of malignant neoplasm of breast: Secondary | ICD-10-CM | POA: Insufficient documentation

## 2020-10-08 DIAGNOSIS — M7751 Other enthesopathy of right foot: Secondary | ICD-10-CM | POA: Diagnosis not present

## 2020-10-08 DIAGNOSIS — Z0000 Encounter for general adult medical examination without abnormal findings: Secondary | ICD-10-CM | POA: Insufficient documentation

## 2020-10-08 DIAGNOSIS — M779 Enthesopathy, unspecified: Secondary | ICD-10-CM | POA: Diagnosis not present

## 2020-10-08 DIAGNOSIS — Q447 Other congenital malformations of liver: Secondary | ICD-10-CM | POA: Insufficient documentation

## 2020-10-08 DIAGNOSIS — Z52008 Unspecified donor, other blood: Secondary | ICD-10-CM | POA: Insufficient documentation

## 2020-10-08 DIAGNOSIS — N281 Cyst of kidney, acquired: Secondary | ICD-10-CM | POA: Insufficient documentation

## 2020-10-08 DIAGNOSIS — B880 Other acariasis: Secondary | ICD-10-CM | POA: Insufficient documentation

## 2020-10-08 DIAGNOSIS — M79671 Pain in right foot: Secondary | ICD-10-CM

## 2020-10-08 DIAGNOSIS — I2510 Atherosclerotic heart disease of native coronary artery without angina pectoris: Secondary | ICD-10-CM | POA: Insufficient documentation

## 2020-10-08 DIAGNOSIS — I10 Essential (primary) hypertension: Secondary | ICD-10-CM | POA: Insufficient documentation

## 2020-10-08 MED ORDER — TRIAMCINOLONE ACETONIDE 10 MG/ML IJ SUSP
10.0000 mg | Freq: Once | INTRAMUSCULAR | Status: AC
  Administered 2020-10-08: 10 mg

## 2020-10-08 NOTE — Progress Notes (Signed)
Subjective: Breanna Nelson is a 70 y.o. female patient who presents for L>R pain at bunion at 5th toe joint.  Reports that the pain as flare up over the last several days. States that when she's in a shoe it rubs but clarks help. No other pedal complaints.   Patient Active Problem List   Diagnosis Date Noted   Age-related osteoporosis without current pathological fracture 10/08/2020   Atherosclerosis of coronary artery 10/08/2020   Blood donor 10/08/2020   Chiggers 10/08/2020   Encounter for general adult medical examination without abnormal findings 10/08/2020   Essential (primary) hypertension 10/08/2020   Osteoporosis without current pathological fracture 10/08/2020   Personal history of malignant neoplasm of breast 10/08/2020   Renal cyst 10/08/2020   Riedel's lobe of liver 10/08/2020   Post-menopausal bleeding 03/03/2020   S/P mastectomy 06/14/2013   Bleeding 06/13/2013   Malignant neoplasm of upper-outer quadrant of right breast in female, estrogen receptor positive ([place]) 05/10/2013    Current Outpatient Medications on File Prior to Visit  Medication Sig Dispense Refill   amLODipine (NORVASC) 5 MG tablet every evening.     aspirin 81 MG chewable tablet      Calcium 1200-1000 MG-UNIT CHEW Chew 1 capsule by mouth daily.     calcium carbonate (OSCAL) 1500 (600 Ca) MG TABS tablet      Cholecalciferol (VITAMIN D-3) 1000 UNITS CAPS Take 1 capsule by mouth daily.     hydrocortisone 2.5 % cream Apply topically.     Multiple Vitamin (MULTIVITAMIN) tablet Take 1 tablet by mouth daily.     Multiple Vitamins-Minerals (CENTRUM WOMEN) TABS      rosuvastatin (CRESTOR) 5 MG tablet Take 1 tablet (5 mg total) by mouth daily.     vitamin C (ASCORBIC ACID) 500 MG tablet Take 1,000 mg by mouth daily.     No current facility-administered medications on file prior to visit.    No Known Allergies  Objective:  General: Alert and oriented x3 in no acute distress  Dermatology: Callus plantar  hallux Right foot. No open lesions bilateral lower extremities, no webspace macerations, no ecchymosis bilateral, all nails x 10 are well manicured.  Vascular: Dorsalis Pedis and Posterior Tibial pedal pulses palpable, 1/4 bilateral, Capillary Fill Time 3 seconds,(+) pedal hair growth bilateral, no edema bilateral lower extremities, Temperature gradient within normal limits.  Neurology: Johney Maine sensation intact via light touch bilateral.  Musculoskeletal: Mild tenderness with palpation at lesser MTPJ 5th on left>right right,No pain with calf compression bilateral. There is decreased 1st MPJ rom Right>Left with functional limitus noted on weightbearing exam and hallux extensus deformity. + taliors bunion bilateral. Strength within normal limits in all groups bilateral.   Assessment and Plan: Problem List Items Addressed This Visit   None Visit Diagnoses     Capsulitis    -  Primary   Tailor's bunion of both feet       Callus       Left foot pain       Right foot pain            -Complete examination performed -Discussed treatement options for Capsulitis at lesser MTPJ and callus  -After oral consent and aseptic prep, injected a mixture containing 1 ml of 2%  plain lidocaine, 1 ml 0.5% plain marcaine, 0.5 ml of kenalog 10 and 0.5 ml of dexamethasone phosphate into 5th MTPJ at area of most pain on the left without complication. Post-injection care discussed with patient.  -Dispensed tailors bunion cushions  -  At no charge mechanically debrided callus plantar hallux on right and left sub met 5 bilateral using 15 blade -Continue with good supportive shioes  -Patient to return to office as needed or sooner if condition worsens.  Landis Martins, DPM

## 2021-03-25 DIAGNOSIS — R102 Pelvic and perineal pain: Secondary | ICD-10-CM | POA: Diagnosis not present

## 2021-03-25 DIAGNOSIS — R1032 Left lower quadrant pain: Secondary | ICD-10-CM | POA: Diagnosis not present

## 2021-04-27 ENCOUNTER — Ambulatory Visit (HOSPITAL_COMMUNITY)
Admission: RE | Admit: 2021-04-27 | Discharge: 2021-04-27 | Disposition: A | Discharge status: Home / Self Care | Payer: Medicare PPO | Source: Ambulatory Visit | Attending: Hematology and Oncology | Admitting: Hematology and Oncology

## 2021-04-27 ENCOUNTER — Other Ambulatory Visit: Payer: Self-pay

## 2021-04-27 ENCOUNTER — Encounter: Payer: Self-pay | Admitting: Hematology and Oncology

## 2021-04-27 ENCOUNTER — Inpatient Hospital Stay: Payer: Medicare PPO

## 2021-04-27 ENCOUNTER — Inpatient Hospital Stay: Payer: Medicare PPO | Attending: Hematology and Oncology | Admitting: Hematology and Oncology

## 2021-04-27 VITALS — BP 155/93 | HR 79 | Temp 98.1°F | Resp 18 | Ht 66.5 in | Wt 119.9 lb

## 2021-04-27 DIAGNOSIS — Z853 Personal history of malignant neoplasm of breast: Secondary | ICD-10-CM | POA: Insufficient documentation

## 2021-04-27 DIAGNOSIS — I10 Essential (primary) hypertension: Secondary | ICD-10-CM | POA: Diagnosis not present

## 2021-04-27 DIAGNOSIS — R109 Unspecified abdominal pain: Secondary | ICD-10-CM | POA: Diagnosis not present

## 2021-04-27 DIAGNOSIS — Z170 Estrogen receptor positive status [ER+]: Secondary | ICD-10-CM | POA: Diagnosis not present

## 2021-04-27 DIAGNOSIS — M810 Age-related osteoporosis without current pathological fracture: Secondary | ICD-10-CM | POA: Insufficient documentation

## 2021-04-27 DIAGNOSIS — Z9011 Acquired absence of right breast and nipple: Secondary | ICD-10-CM | POA: Insufficient documentation

## 2021-04-27 DIAGNOSIS — M25551 Pain in right hip: Secondary | ICD-10-CM | POA: Diagnosis not present

## 2021-04-27 DIAGNOSIS — C50411 Malignant neoplasm of upper-outer quadrant of right female breast: Secondary | ICD-10-CM | POA: Diagnosis not present

## 2021-04-27 DIAGNOSIS — M25552 Pain in left hip: Secondary | ICD-10-CM | POA: Diagnosis not present

## 2021-04-27 DIAGNOSIS — J449 Chronic obstructive pulmonary disease, unspecified: Secondary | ICD-10-CM | POA: Diagnosis not present

## 2021-04-27 DIAGNOSIS — Z79899 Other long term (current) drug therapy: Secondary | ICD-10-CM | POA: Diagnosis not present

## 2021-04-27 DIAGNOSIS — Z9981 Dependence on supplemental oxygen: Secondary | ICD-10-CM | POA: Diagnosis not present

## 2021-04-27 LAB — CBC WITH DIFFERENTIAL (CANCER CENTER ONLY)
Abs Immature Granulocytes: 0.01 10*3/uL (ref 0.00–0.07)
Basophils Absolute: 0 10*3/uL (ref 0.0–0.1)
Basophils Relative: 1 %
Eosinophils Absolute: 0.1 10*3/uL (ref 0.0–0.5)
Eosinophils Relative: 1 %
HCT: 42.4 % (ref 36.0–46.0)
Hemoglobin: 14.1 g/dL (ref 12.0–15.0)
Immature Granulocytes: 0 %
Lymphocytes Relative: 18 %
Lymphs Abs: 1.2 10*3/uL (ref 0.7–4.0)
MCH: 31.2 pg (ref 26.0–34.0)
MCHC: 33.3 g/dL (ref 30.0–36.0)
MCV: 93.8 fL (ref 80.0–100.0)
Monocytes Absolute: 0.7 10*3/uL (ref 0.1–1.0)
Monocytes Relative: 10 %
Neutro Abs: 4.9 10*3/uL (ref 1.7–7.7)
Neutrophils Relative %: 70 %
Platelet Count: 208 10*3/uL (ref 150–400)
RBC: 4.52 MIL/uL (ref 3.87–5.11)
RDW: 12.7 % (ref 11.5–15.5)
WBC Count: 6.9 10*3/uL (ref 4.0–10.5)
nRBC: 0 % (ref 0.0–0.2)

## 2021-04-27 LAB — CMP (CANCER CENTER ONLY)
ALT: 22 U/L (ref 0–44)
AST: 31 U/L (ref 15–41)
Albumin: 4.4 g/dL (ref 3.5–5.0)
Alkaline Phosphatase: 82 U/L (ref 38–126)
Anion gap: 6 (ref 5–15)
BUN: 11 mg/dL (ref 8–23)
CO2: 33 mmol/L — ABNORMAL HIGH (ref 22–32)
Calcium: 9.4 mg/dL (ref 8.9–10.3)
Chloride: 101 mmol/L (ref 98–111)
Creatinine: 0.7 mg/dL (ref 0.44–1.00)
GFR, Estimated: >60 mL/min (ref >60)
Glucose, Bld: 109 mg/dL — ABNORMAL HIGH (ref 70–99)
Potassium: 3.9 mmol/L (ref 3.5–5.1)
Sodium: 140 mmol/L (ref 135–145)
Total Bilirubin: 0.5 mg/dL (ref 0.3–1.2)
Total Protein: 7.4 g/dL (ref 6.5–8.1)

## 2021-04-27 NOTE — Progress Notes (Signed)
?[facility]  ?Telephone:(336) [phone] [phone]  ? ? ? ?ID: Breanna Nelson DOB: June 25, 1950  MR#: [id number]  CSN#:[id number] ? ?Patient Care Team: ?[name], MD as PCP - General (Internal Medicine) ?Magrinat, [name], MD (Inactive) as Consulting Physician (Oncology) ?[name], MD as Consulting Physician (General Surgery) ?[name], MD as Consulting Physician (Radiation Oncology) ?[name], MD as Consulting Physician (Obstetrics and Gynecology) ?OTHER MD: Dr [name], surgery ? ? ?CHIEF COMPLAINT: Estrogen receptor positive breast cancer (s/p right mastectomy) ? ?CURRENT TREATMENT: Observation ? ? ?INTERVAL HISTORY: ?"Breanna Nelson" returns today for follow-up of her estrogen receptor positive breast cancer. She is now under observation.  She arrived today with her daughter.  They had some list of questions to run by.  Patient is under the belief that she has active cancer.  She tells me that she was told that the cancer will return when she stops the tamoxifen and hence she believes that she has active cancer.  She also complains of abdominal pain, points to the bilateral lower quadrants versus hips.  She cannot quite think of an aggravating or relieving factor.  She says the pain is very random and resolve spontaneously.  She has been noticing this pain for the past 2 months.  She denies any changes in bowel habits or changes in breathing.  No other bone pains. ?She had a couple bone scans, last bone scan without any definitive evidence of metastatic disease.  She is however very anxious about breast cancer recurrence. ? ? ? COVID 19 VACCINATION STATUS:    ? ? ?BREAST CANCER HISTORY: ?From the original intake note: ? ?The patient herself noted a lump in her right breast late April 20 15th. She brought it to her primary care physician's attention and on 05/06/2013 had a bilateral mammography with tomography at [facility]. The breast density was category C. There was a new  area of architectural distortion at the 11:00 position of the right breast with no other significant findings. Accordingly a right breast ultrasound was performed the same day. This identified a 1 cm irregular mass in the right breast at the 10:00 position. This correlated well with the palpable findings and biopsy of the mass was obtained the same day. This showed (SAA [id number]) and invasive mammary carcinoma, with strong diffuse expression of E-cadherin both an invasive and in situ carcinomas, supporting a ductal etiology. Estrogen receptor was 100% positive, progesterone receptor was 96% positive, with strong staining intensity, the MIB-1 was 3%, and there was no HER-2 amplification, the signals ratio being 1.3 and the number per cell 2.5. ? ?The patient underwent bilateral breast MRI 05/13/2013. This confirmed, in the upper outer quadrant of the right breast, several small nodules, associated with an area of postbiopsy hematoma. Taking together the area of enhancement and hematoma the largest measuring was 1.8 cm. There were no masses or abnormal enhancement seen in the left breast and no suspicious lymph nodes. ? ?The patient was seen in the multidisciplinary clinic by surgery and radiation oncology 05/15/2013. After discussion here and also with Dr. [name], and with a good understanding of the equivalent results between mastectomy and breast conservation surgery plus radiation, the patient opted for mastectomy (without planned reconstruction) and sentinel lymph node sampling. This was performed 06/13/2013 at [facility]. There was some postoperative bleeding requiring evacuation of a hematoma. The final pathology ([id number]) showed a 1.5 cm invasive mammary carcinoma (either at lobular breast cancer with the unusual feature of being  E-cadherin positive, or a ductal breast cancer with lobular features), grade 2, with a positive intramammary lymph node. The sentinel lymph node and 4 additional axillary  lymph nodes were negative. The tumor was again strongly estrogen and progesterone receptor positive, again HER-2 negative by FISH, with a low MIB-1-1, not read as 10%. Margins were negative. ? ?On 07/03/2013 the patient was reevaluated by radiation oncology at they agreed that postmastectomy radiation was not indicated. They suggested consultation to medical oncology to consider adjuvant hormonal therapy. ? ?The patient's subsequent history is as detailed below. ? ? ?PAST MEDICAL HISTORY: ?Past Medical History:  ?Diagnosis Date  ? Anxiety   ? Breast cancer of upper-outer quadrant of right female breast ([place]) dx'd 04/2013  ? mastectomy with lymph node removed,   ? Hypertension   ? Osteoporosis   ? PMB (postmenopausal bleeding)   ? last 3 months  ? Wears glasses   ? for reading  ? ? ?PAST SURGICAL HISTORY: ?Past Surgical History:  ?Procedure Laterality Date  ? DILATATION & CURETTAGE/HYSTEROSCOPY WITH MYOSURE N/A 03/20/2020  ? Procedure: DILATATION & CURETTAGE/HYSTEROSCOPY WITH MYOSURE (SMALL SCOPE);  Surgeon: [name], MD;  Location: [facility];  Service: Gynecology;  Laterality: N/A;  ? SIMPLE MASTECTOMY W/ SENTINEL NODE BIOPSY Right 06/13/13  ? by Dr. [name]  ? ? ?FAMILY HISTORY ?Family History  ?Problem Relation Age of Onset  ? Skin cancer Mother   ? Testicular cancer Brother   ? the patient's father died at the age of 110. The patient's mother died at the age of 30 from heart disease. She also had Alzheimer's disease. The patient had one brother who died in the age of 33 with testicular cancer. One sister died as a baby. There is no history of breast or ovarian cancer in the family. ? ? ?GYNECOLOGIC HISTORY:  ?No LMP recorded. Patient is postmenopausal. ?Menarche age 33, first live birth age 23. The patient is GX P2. She does not recall when she went through menopause, but he was more than 10 years ago. She never took hormone replacement. She did take birth control pills for a brief  period, with no complications ? ? ?SOCIAL HISTORY:  ?Breanna Nelson works at [employer]. At home it's just she and her dog Breanna Nelson. Her husband died from complications of lymphoma. He also had undergone a colectomy in 2004. "He had more than 200 polyps". The patient's daughter Breanna Nelson lives in [place] where she works as a Engineer, structural. She also does [employer]. Her son Breanna Nelson is a truck driver.  He recently underwent colonoscopy.  He lives near [place], in [place].  The patient has 4 grandchildren. The patient is a [employer].--Breanna Nelson tells me she is fianc?ed to Breanna Nelson, who is a church member with her.  Unfortunately he has significant COPD problems and is on oxygen.  That is why she was tested for coronavirus earlier this year. ?  ? ADVANCED DIRECTIVES: Not in place; the patient intends to name her daughter Breanna Nelson as her healthcare power of attorney. ? ? ?HEALTH MAINTENANCE: ?Social History  ? ?Tobacco Use  ? Smoking status: Never  ? Smokeless tobacco: Never  ?Vaping Use  ? Vaping Use: Never used  ?Substance Use Topics  ? Alcohol use: No  ? Drug use: Never  ? ? ? Colonoscopy: ? PAP: ? Bone density:  ? Lipid panel: ? ?No Known Allergies ? ?Current Outpatient Medications  ?Medication Sig Dispense Refill  ? amLODipine (NORVASC) 5  MG tablet every evening.    ? aspirin 81 MG chewable tablet     ? Calcium 1200-1000 MG-UNIT CHEW Chew 1 capsule by mouth daily.    ? calcium carbonate (OSCAL) 1500 (600 Ca) MG TABS tablet     ? Cholecalciferol (VITAMIN D-3) 1000 UNITS CAPS Take 1 capsule by mouth daily.    ? hydrocortisone 2.5 % cream Apply topically.    ? Multiple Vitamin (MULTIVITAMIN) tablet Take 1 tablet by mouth daily.    ? Multiple Vitamins-Minerals (CENTRUM WOMEN) TABS     ? rosuvastatin (CRESTOR) 5 MG tablet Take 1 tablet (5 mg total) by mouth daily.    ? vitamin C (ASCORBIC ACID) 500 MG tablet Take 1,000 mg by mouth daily.    ? ?No current facility-administered medications for this visit.   ? ? ?OBJECTIVE: White woman who appears stated age ? ?Vitals:  ? 04/27/21 1506  ?BP: (!) 155/93  ?Pulse: 79  ?Resp: 18  ?Temp: 98.1 ?F (36.7 ?C)  ?SpO2: 98%  ?   Body mass index is 19.06 kg/m?Marland Kitchen    ECOG FS:1 - [employer]

## 2021-05-02 IMAGING — CT CT HEAD W/O CM
3 series · 15 of 47 positions shown, 18 images · non-contrast
Comparison: 01/12/2016

CLINICAL DATA: Recent syncopal episode

EXAM:
CT HEAD WITHOUT CONTRAST
TECHNIQUE: Contiguous axial images were obtained from the base of the skull
through the vertex without intravenous contrast.

[Series 2: head w o · axial · 0.42mm/px · z∈[+88,+213]mm · 9 of 31 slices shown, 12 images]
[im 3/31  brain]
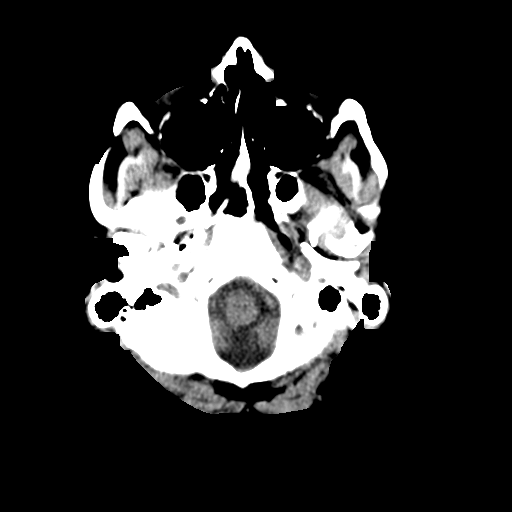
[im 3/31  bone]
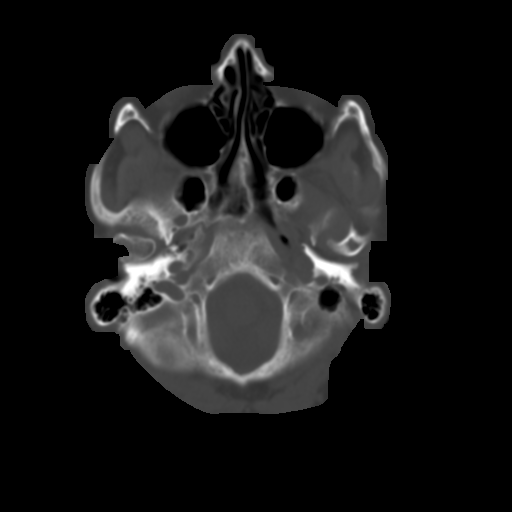
[im 6/31  brain]
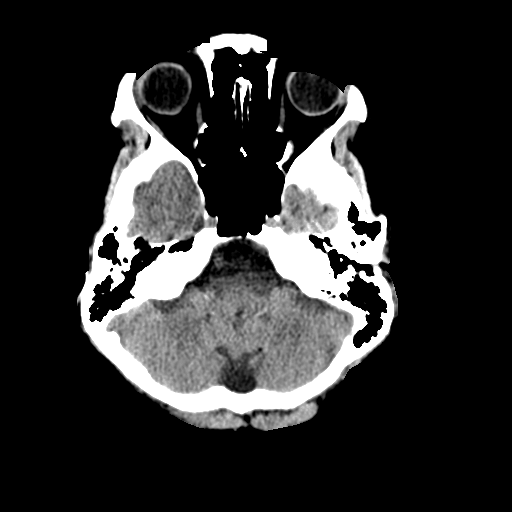
[im 9/31  brain]
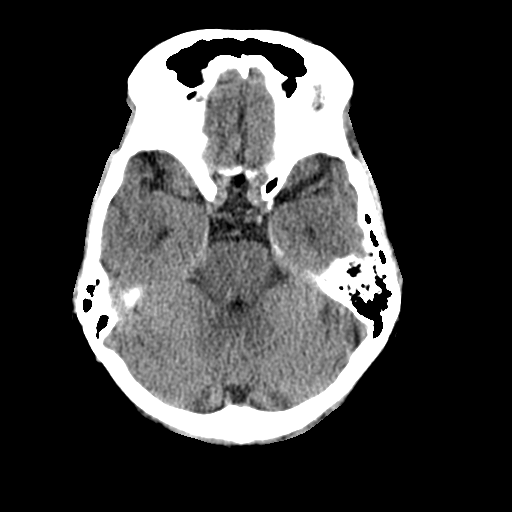
[im 12/31  brain]
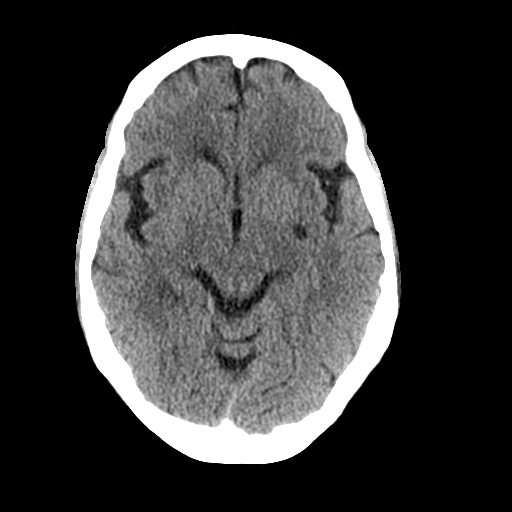
[im 16/31  brain]
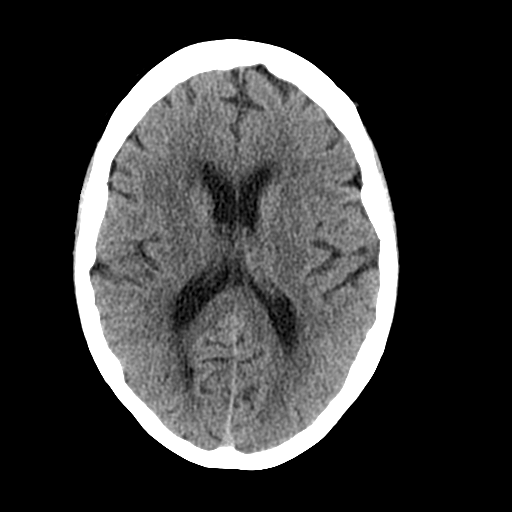
[im 16/31  bone]
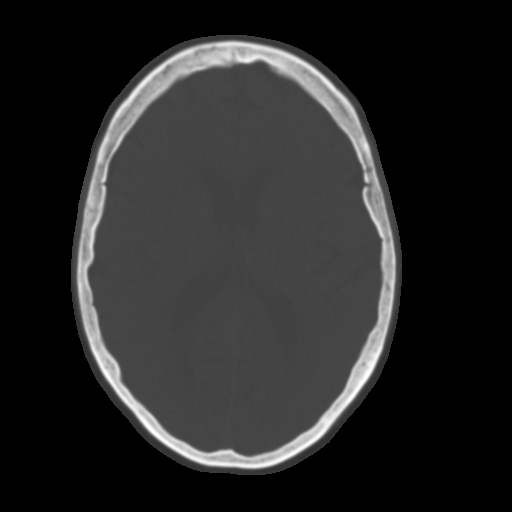
[im 19/31  brain]
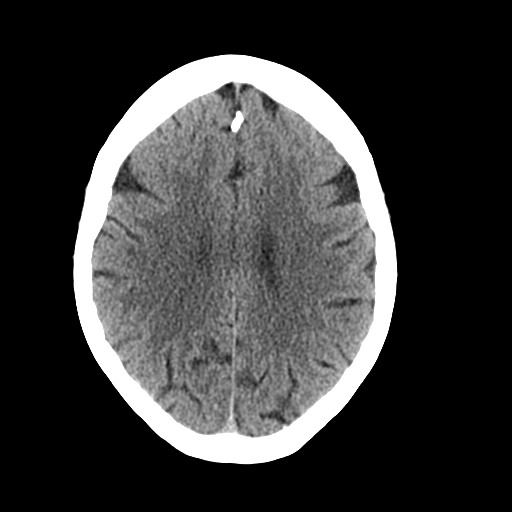
[im 22/31  brain]
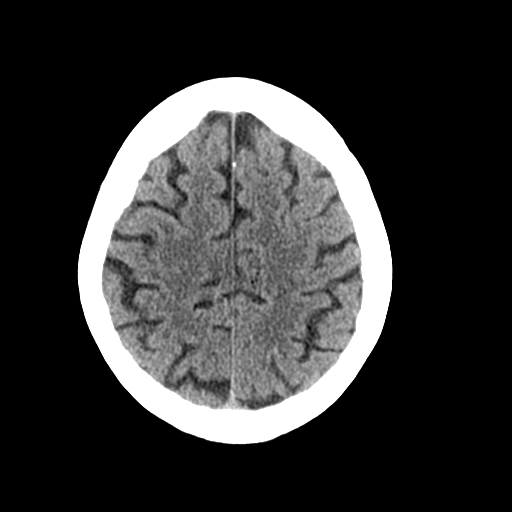
[im 25/31  brain]
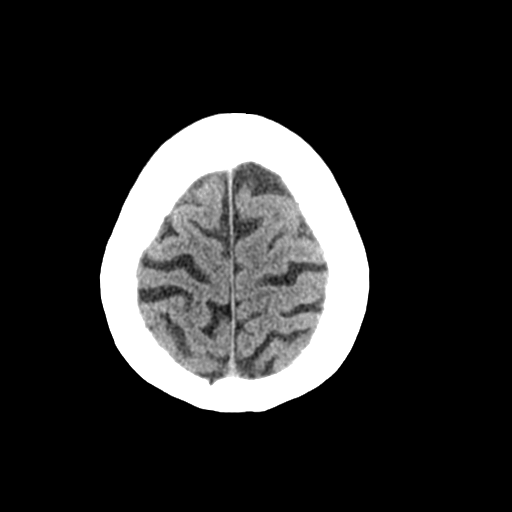
[im 28/31  brain]
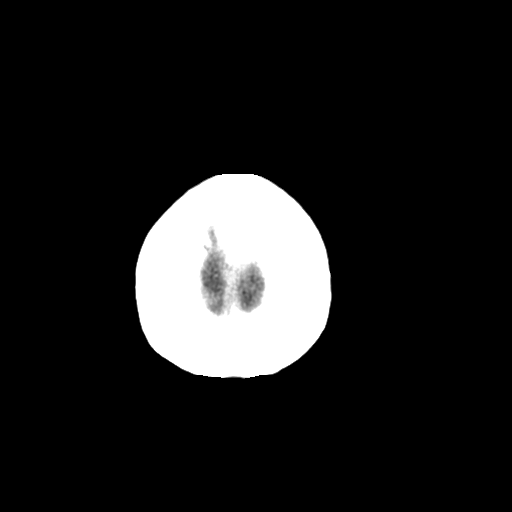
[im 28/31  bone]
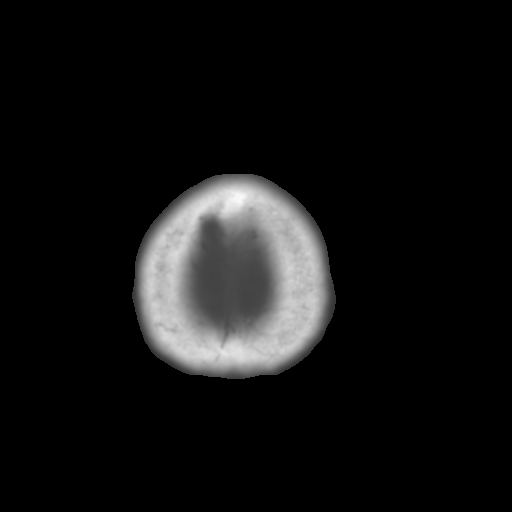

[Series 4: coronal soft · coronal · 0.31mm/px · 3 of 69 slices shown]
[im 23/69  brain]
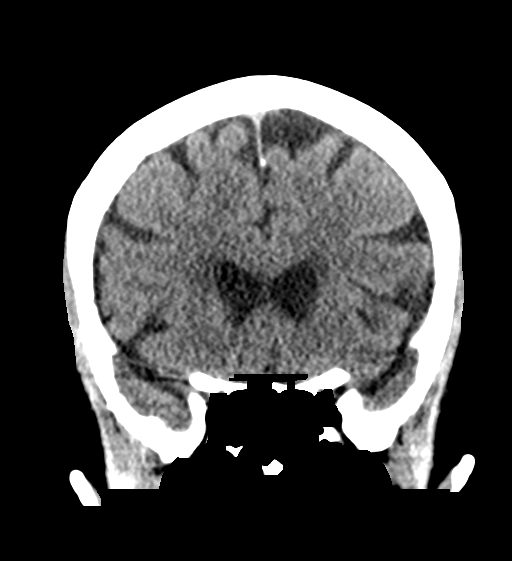
[im 31/69  brain]
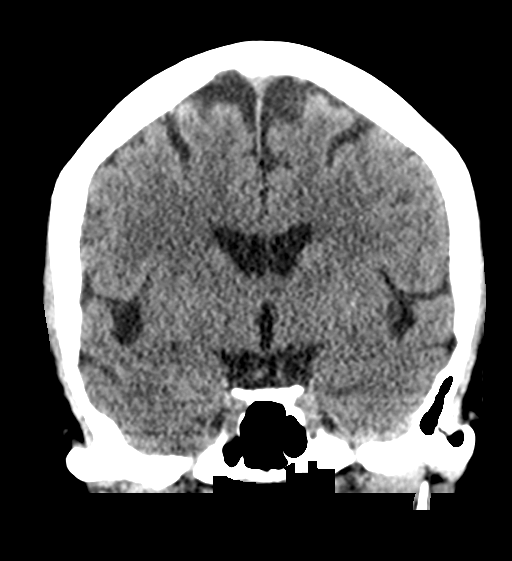
[im 38/69  brain]
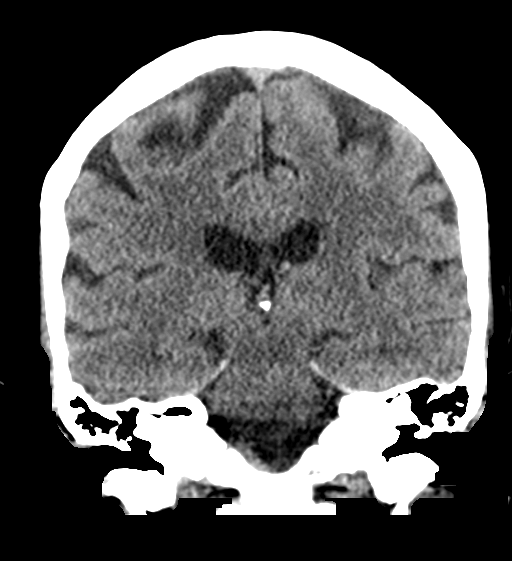

[Series 5: sagittal soft · sagittal · 0.31mm/px · 3 of 59 slices shown]
[im 20/59  brain]
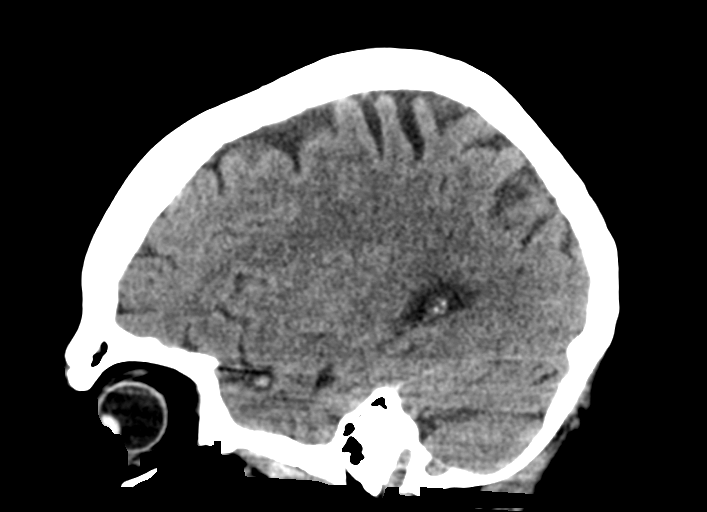
[im 30/59  brain]
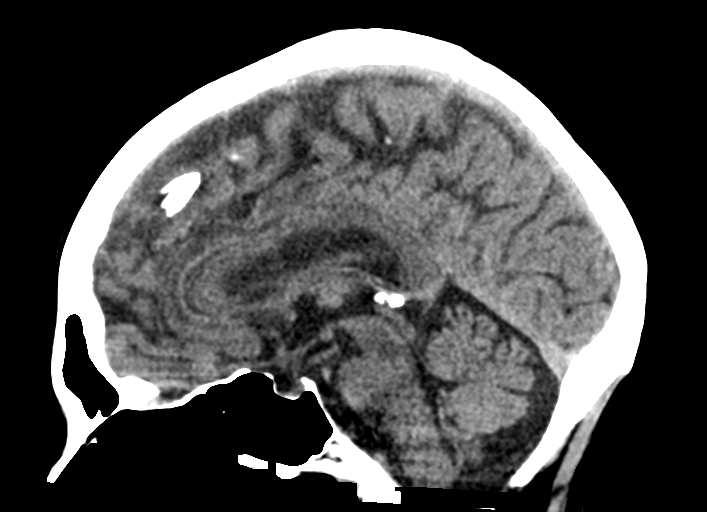
[im 39/59  brain]
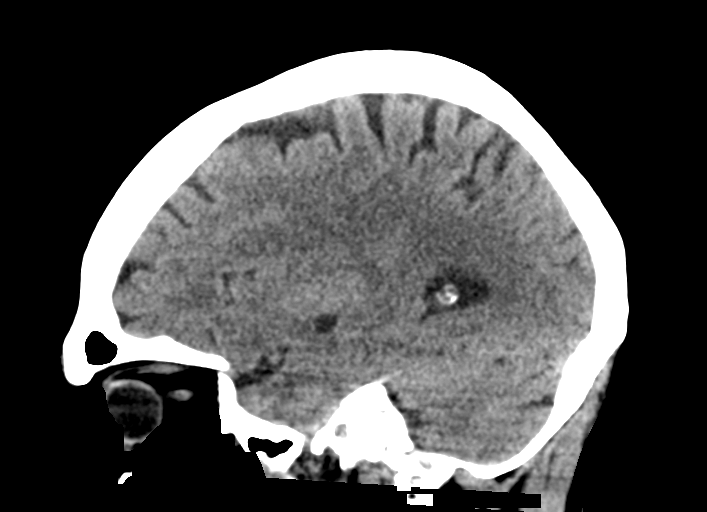

[15 of 47 positions shown; findings below may reference images not displayed]

FINDINGS: Brain: Mild atrophic changes are noted. Small lacunar infarct is
noted in the left subinsular cortex stable from the prior exam. No
findings to suggest acute hemorrhage, acute infarction or
space-occupying mass lesion are noted.

Vascular: No hyperdense vessel or unexpected calcification.

Skull: Normal. Negative for fracture or focal lesion.

Sinuses/Orbits: No acute finding.

Other: None.
IMPRESSION: Chronic atrophic and ischemic changes without acute abnormality.

## 2021-06-03 DIAGNOSIS — R7303 Prediabetes: Secondary | ICD-10-CM | POA: Diagnosis not present

## 2021-06-03 DIAGNOSIS — I10 Essential (primary) hypertension: Secondary | ICD-10-CM | POA: Diagnosis not present

## 2021-06-03 DIAGNOSIS — M810 Age-related osteoporosis without current pathological fracture: Secondary | ICD-10-CM | POA: Diagnosis not present

## 2021-06-08 DIAGNOSIS — I10 Essential (primary) hypertension: Secondary | ICD-10-CM | POA: Diagnosis not present

## 2021-06-08 DIAGNOSIS — M810 Age-related osteoporosis without current pathological fracture: Secondary | ICD-10-CM | POA: Diagnosis not present

## 2021-06-08 DIAGNOSIS — I2510 Atherosclerotic heart disease of native coronary artery without angina pectoris: Secondary | ICD-10-CM | POA: Diagnosis not present

## 2021-06-08 DIAGNOSIS — Z0000 Encounter for general adult medical examination without abnormal findings: Secondary | ICD-10-CM | POA: Diagnosis not present

## 2021-07-08 DIAGNOSIS — M810 Age-related osteoporosis without current pathological fracture: Secondary | ICD-10-CM | POA: Diagnosis not present

## 2021-07-15 DIAGNOSIS — M810 Age-related osteoporosis without current pathological fracture: Secondary | ICD-10-CM | POA: Diagnosis not present

## 2021-07-23 DIAGNOSIS — H1589 Other disorders of sclera: Secondary | ICD-10-CM | POA: Diagnosis not present

## 2021-08-04 DIAGNOSIS — L814 Other melanin hyperpigmentation: Secondary | ICD-10-CM | POA: Diagnosis not present

## 2021-08-04 DIAGNOSIS — L821 Other seborrheic keratosis: Secondary | ICD-10-CM | POA: Diagnosis not present

## 2021-08-04 DIAGNOSIS — D225 Melanocytic nevi of trunk: Secondary | ICD-10-CM | POA: Diagnosis not present

## 2021-08-04 DIAGNOSIS — L570 Actinic keratosis: Secondary | ICD-10-CM | POA: Diagnosis not present

## 2021-08-17 DIAGNOSIS — M810 Age-related osteoporosis without current pathological fracture: Secondary | ICD-10-CM | POA: Diagnosis not present

## 2021-08-30 DIAGNOSIS — H35362 Drusen (degenerative) of macula, left eye: Secondary | ICD-10-CM | POA: Diagnosis not present

## 2021-08-30 DIAGNOSIS — H524 Presbyopia: Secondary | ICD-10-CM | POA: Diagnosis not present

## 2021-09-03 DIAGNOSIS — Z1231 Encounter for screening mammogram for malignant neoplasm of breast: Secondary | ICD-10-CM | POA: Diagnosis not present

## 2021-09-09 ENCOUNTER — Inpatient Hospital Stay: Payer: Medicare PPO | Admitting: Adult Health

## 2021-11-15 DIAGNOSIS — C50911 Malignant neoplasm of unspecified site of right female breast: Secondary | ICD-10-CM | POA: Diagnosis not present

## 2021-12-16 DIAGNOSIS — C50911 Malignant neoplasm of unspecified site of right female breast: Secondary | ICD-10-CM | POA: Diagnosis not present

## 2022-01-17 DIAGNOSIS — H52213 Irregular astigmatism, bilateral: Secondary | ICD-10-CM | POA: Diagnosis not present

## 2022-01-17 DIAGNOSIS — H25013 Cortical age-related cataract, bilateral: Secondary | ICD-10-CM | POA: Diagnosis not present

## 2022-01-17 DIAGNOSIS — H40033 Anatomical narrow angle, bilateral: Secondary | ICD-10-CM | POA: Diagnosis not present

## 2022-01-17 DIAGNOSIS — H1589 Other disorders of sclera: Secondary | ICD-10-CM | POA: Diagnosis not present

## 2022-01-17 DIAGNOSIS — H2513 Age-related nuclear cataract, bilateral: Secondary | ICD-10-CM | POA: Diagnosis not present

## 2022-01-17 DIAGNOSIS — H2512 Age-related nuclear cataract, left eye: Secondary | ICD-10-CM | POA: Diagnosis not present

## 2022-01-17 DIAGNOSIS — H524 Presbyopia: Secondary | ICD-10-CM | POA: Diagnosis not present

## 2022-02-01 DIAGNOSIS — H25812 Combined forms of age-related cataract, left eye: Secondary | ICD-10-CM | POA: Diagnosis not present

## 2022-02-01 DIAGNOSIS — H2512 Age-related nuclear cataract, left eye: Secondary | ICD-10-CM | POA: Diagnosis not present

## 2022-03-16 DIAGNOSIS — H25011 Cortical age-related cataract, right eye: Secondary | ICD-10-CM | POA: Diagnosis not present

## 2022-03-16 DIAGNOSIS — H2511 Age-related nuclear cataract, right eye: Secondary | ICD-10-CM | POA: Diagnosis not present

## 2022-04-05 DIAGNOSIS — H25811 Combined forms of age-related cataract, right eye: Secondary | ICD-10-CM | POA: Diagnosis not present

## 2022-04-05 DIAGNOSIS — H2511 Age-related nuclear cataract, right eye: Secondary | ICD-10-CM | POA: Diagnosis not present

## 2022-04-05 DIAGNOSIS — H25011 Cortical age-related cataract, right eye: Secondary | ICD-10-CM | POA: Diagnosis not present

## 2022-04-19 DIAGNOSIS — S90861A Insect bite (nonvenomous), right foot, initial encounter: Secondary | ICD-10-CM | POA: Diagnosis not present

## 2022-04-19 DIAGNOSIS — W57XXXA Bitten or stung by nonvenomous insect and other nonvenomous arthropods, initial encounter: Secondary | ICD-10-CM | POA: Diagnosis not present

## 2022-04-28 ENCOUNTER — Other Ambulatory Visit: Payer: Self-pay

## 2022-04-28 ENCOUNTER — Inpatient Hospital Stay: Payer: Medicare PPO | Attending: Hematology and Oncology | Admitting: Hematology and Oncology

## 2022-04-28 VITALS — BP 140/74 | HR 79 | Temp 97.7°F | Resp 16 | Ht 66.0 in | Wt 129.3 lb

## 2022-04-28 DIAGNOSIS — C50411 Malignant neoplasm of upper-outer quadrant of right female breast: Secondary | ICD-10-CM

## 2022-04-28 DIAGNOSIS — C50412 Malignant neoplasm of upper-outer quadrant of left female breast: Secondary | ICD-10-CM | POA: Diagnosis present

## 2022-04-28 DIAGNOSIS — Z08 Encounter for follow-up examination after completed treatment for malignant neoplasm: Secondary | ICD-10-CM | POA: Insufficient documentation

## 2022-04-28 DIAGNOSIS — Z9981 Dependence on supplemental oxygen: Secondary | ICD-10-CM | POA: Diagnosis not present

## 2022-04-28 DIAGNOSIS — Z853 Personal history of malignant neoplasm of breast: Secondary | ICD-10-CM | POA: Diagnosis not present

## 2022-04-28 DIAGNOSIS — Z170 Estrogen receptor positive status [ER+]: Secondary | ICD-10-CM | POA: Diagnosis not present

## 2022-04-28 NOTE — Progress Notes (Signed)
[facility]  Telephone:(336) [phone] Fax:(336) [phone]     ID: Breanna Nelson DOB: Jan 25, 1950  MR#: [id number]  CSN#:[id number]  Patient Care Team: Merri Brunette, MD as PCP - General (Internal Medicine) Magrinat, Valentino Hue, MD (Inactive) as Consulting Physician (Oncology) Harriette Bouillon, MD as Consulting Physician (General Surgery) Antony Blackbird, MD as Consulting Physician (Radiation Oncology) Zelphia Cairo, MD as Consulting Physician (Obstetrics and Gynecology) OTHER MD: Dr Milbert Coulter, surgery   CHIEF COMPLAINT: Estrogen receptor positive breast cancer (s/p right mastectomy)  CURRENT TREATMENT: Observation  INTERVAL HISTORY:  "Breanna Nelson" returns today for follow-up of her estrogen receptor positive breast cancer. She is now under observation.   She once again continues to be worried about breast cancer since she is having neck pains ongoing.  She complained of the neck pain even in the past.  She states her neck hurts every time she moves it.  She wonders if the breast cancer has returned in the lymph nodes because she feels lumpy. She is now on tymlos daily for bone loss for almost 10 months. She doesn't really have the hip pain anymore. She denies any breast changes otherwise.  No change in breathing, bowel habits or urinary habits.   COVID 19 VACCINATION STATUS:      BREAST CANCER HISTORY: From the original intake note:  The patient herself noted a lump in her right breast late April 20 15th. She brought it to her primary care physician's attention and on 05/06/2013 had a bilateral mammography with tomography at [facility]. The breast density was category C. There was a new area of architectural distortion at the 11:00 position of the right breast with no other significant findings. Accordingly a right breast ultrasound was performed the same day. This identified a 1 cm irregular mass in the right breast at the 10:00 position. This correlated well with the  palpable findings and biopsy of the mass was obtained the same day. This showed (SAA [id number]) and invasive mammary carcinoma, with strong diffuse expression of E-cadherin both an invasive and in situ carcinomas, supporting a ductal etiology. Estrogen receptor was 100% positive, progesterone receptor was 96% positive, with strong staining intensity, the MIB-1 was 3%, and there was no HER-2 amplification, the signals ratio being 1.3 and the number per cell 2.5.  The patient underwent bilateral breast MRI 05/13/2013. This confirmed, in the upper outer quadrant of the right breast, several small nodules, associated with an area of postbiopsy hematoma. Taking together the area of enhancement and hematoma the largest measuring was 1.8 cm. There were no masses or abnormal enhancement seen in the left breast and no suspicious lymph nodes.  The patient was seen in the multidisciplinary clinic by surgery and radiation oncology 05/15/2013. After discussion here and also with Dr. Roseanne Reno, and with a good understanding of the equivalent results between mastectomy and breast conservation surgery plus radiation, the patient opted for mastectomy (without planned reconstruction) and sentinel lymph node sampling. This was performed 06/13/2013 at [facility]. There was some postoperative bleeding requiring evacuation of a hematoma. The final pathology ([id number]) showed a 1.5 cm invasive mammary carcinoma (either at lobular breast cancer with the unusual feature of being E-cadherin positive, or a ductal breast cancer with lobular features), grade 2, with a positive intramammary lymph node. The sentinel lymph node and 4 additional axillary lymph nodes were negative. The tumor was again strongly estrogen and progesterone receptor positive, again HER-2 negative by FISH, with a low MIB-1-1, not read as 10%.  Margins were negative.  On 07/03/2013 the patient was reevaluated by radiation oncology at they agreed that  postmastectomy radiation was not indicated. They suggested consultation to medical oncology to consider adjuvant hormonal therapy.  The patient's subsequent history is as detailed below.   PAST MEDICAL HISTORY: Past Medical History:  Diagnosis Date   Anxiety    Breast cancer of upper-outer quadrant of right female breast ([facility]) dx'd 04/2013   mastectomy with lymph node removed,    Hypertension    Osteoporosis    PMB (postmenopausal bleeding)    last 3 months   Wears glasses    for reading    PAST SURGICAL HISTORY: Past Surgical History:  Procedure Laterality Date   DILATATION & CURETTAGE/HYSTEROSCOPY WITH MYOSURE N/A 03/20/2020   Procedure: DILATATION & CURETTAGE/HYSTEROSCOPY WITH MYOSURE (SMALL SCOPE);  Surgeon: Zelphia Cairo, MD;  Location: [facility];  Service: Gynecology;  Laterality: N/A;   SIMPLE MASTECTOMY W/ SENTINEL NODE BIOPSY Right 06/13/13   by Dr. Lonia Chimera    FAMILY HISTORY Family History  Problem Relation Age of Onset   Skin cancer Mother    Testicular cancer Brother    the patient's father died at the age of 13. The patient's mother died at the age of 32 from heart disease. She also had Alzheimer's disease. The patient had one brother who died in the age of 66 with testicular cancer. One sister died as a baby. There is no history of breast or ovarian cancer in the family.   GYNECOLOGIC HISTORY:  No LMP recorded. Patient is postmenopausal. Menarche age 1, first live birth age 23. The patient is GX P2. She does not recall when she went through menopause, but he was more than 10 years ago. She never took hormone replacement. She did take birth control pills for a brief period, with no complications   SOCIAL HISTORY:  Breanna Nelson works at [employer]. At home it's just she and her dog Breanna Nelson. Her husband died from complications of lymphoma. He also had undergone a colectomy in 2004. "He had more than 200 polyps". The patient's daughter Breanna Nelson  lives in [place] where she works as a Emergency planning/management officer. She also does [employer]. Her son Odalys Win is a truck driver.  He recently underwent colonoscopy.  He lives near [place], in [place].  The patient has 4 grandchildren. The patient is a [employer].--[name] tells me she is fianced to Mr. Pixie Casino, who is a church member with her.  Unfortunately he has significant COPD problems and is on oxygen.  That is why she was tested for coronavirus earlier this year.    ADVANCED DIRECTIVES: Not in place; the patient intends to name her daughter Breanna Nelson as her healthcare power of attorney.   HEALTH MAINTENANCE: Social History   Tobacco Use   Smoking status: Never   Smokeless tobacco: Never  Vaping Use   Vaping Use: Never used  Substance Use Topics   Alcohol use: No   Drug use: Never     Colonoscopy:  PAP:  Bone density:   Lipid panel:  No Known Allergies  Current Outpatient Medications  Medication Sig Dispense Refill   amLODipine (NORVASC) 5 MG tablet every evening.     aspirin 81 MG chewable tablet      Calcium 1200-1000 MG-UNIT CHEW Chew 1 capsule by mouth daily.     calcium carbonate (OSCAL) 1500 (600 Ca) MG TABS tablet      Cholecalciferol (VITAMIN D-3) 1000 UNITS CAPS Take  1 capsule by mouth daily.     hydrocortisone 2.5 % cream Apply topically.     Multiple Vitamin (MULTIVITAMIN) tablet Take 1 tablet by mouth daily.     Multiple Vitamins-Minerals (CENTRUM WOMEN) TABS      rosuvastatin (CRESTOR) 5 MG tablet Take 1 tablet (5 mg total) by mouth daily.     vitamin C (ASCORBIC ACID) 500 MG tablet Take 1,000 mg by mouth daily.     No current facility-administered medications for this visit.    OBJECTIVE: White woman who appears stated age  There were no vitals filed for this visit.    There is no height or weight on file to calculate BMI.    ECOG FS:1 - Symptomatic but completely ambulatory  Physical Exam Constitutional:      Appearance: Normal appearance.   Cardiovascular:     Rate and Rhythm: Normal rate and regular rhythm.  Chest:     Comments: Right breast status postmastectomy.  Left breast palpated.  No area of recurrence.  No regional lymphadenopathy.  No cervical lymphadenopathy. Abdominal:     General: Abdomen is flat.     Palpations: Abdomen is soft.  Musculoskeletal:     Cervical back: Normal range of motion and neck supple. No rigidity.  Lymphadenopathy:     Cervical: No cervical adenopathy.  Neurological:     Mental Status: She is alert.       LAB RESULTS:  CMP     Component Value Date/Time   NA 140 04/27/2021 1611   NA 139 09/12/2016 1235   K 3.9 04/27/2021 1611   K 3.5 09/12/2016 1235   CL 101 04/27/2021 1611   CO2 33 (H) 04/27/2021 1611   CO2 28 09/12/2016 1235   GLUCOSE 109 (H) 04/27/2021 1611   GLUCOSE 118 09/12/2016 1235   BUN 11 04/27/2021 1611   BUN 11.9 09/12/2016 1235   CREATININE 0.70 04/27/2021 1611   CREATININE 0.8 09/12/2016 1235   CALCIUM 9.4 04/27/2021 1611   CALCIUM 8.9 09/12/2016 1235   PROT 7.4 04/27/2021 1611   PROT 6.4 09/12/2016 1235   ALBUMIN 4.4 04/27/2021 1611   ALBUMIN 3.6 09/12/2016 1235   AST 31 04/27/2021 1611   AST 25 09/12/2016 1235   ALT 22 04/27/2021 1611   ALT 15 09/12/2016 1235   ALKPHOS 82 04/27/2021 1611   ALKPHOS 53 09/12/2016 1235   BILITOT 0.5 04/27/2021 1611   BILITOT 0.32 09/12/2016 1235   GFRNONAA >60 04/27/2021 1611   GFRAA >60 09/27/2019 1330    No results found for: "TOTALPROTELP", "ALBUMINELP", "A1GS", "A2GS", "BETS", "BETA2SER", "GAMS", "MSPIKE", "SPEI"  Lab Results  Component Value Date   WBC 6.9 04/27/2021   NEUTROABS 4.9 04/27/2021   HGB 14.1 04/27/2021   HCT 42.4 04/27/2021   MCV 93.8 04/27/2021   PLT 208 04/27/2021    No results found for: "LABCA2"  No components found for: "ZOXWRU045"  No results for input(s): "INR" in the last 168 hours.  No results found for: "LABCA2"  No results found for: "WUJ811"  No results found for:  "CAN125"  No results found for: "CAN153"  No results found for: "CA2729"  No components found for: "HGQUANT"  No results found for: "CEA1", "CEA" / No results found for: "CEA1", "CEA"   No results found for: "AFPTUMOR"  No results found for: "CHROMOGRNA"  No results found for: "KPAFRELGTCHN", "LAMBDASER", "KAPLAMBRATIO" (kappa/lambda light chains)  No results found for: "HGBA", "HGBA2QUANT", "HGBFQUANT", "HGBSQUAN" (Hemoglobinopathy evaluation)   No results found  for: "LDH"  No results found for: "IRON", "TIBC", "IRONPCTSAT" (Iron and TIBC)  Lab Results  Component Value Date   FERRITIN 19 09/07/2017    Urinalysis No results found for: "COLORURINE", "APPEARANCEUR", "LABSPEC", "PHURINE", "GLUCOSEU", "HGBUR", "BILIRUBINUR", "KETONESUR", "PROTEINUR", "UROBILINOGEN", "NITRITE", "LEUKOCYTESUR"   STUDIES: No results found.   ASSESSMENT: 72 y.o. [place] woman status post right mastectomy and sentinel lymph node sampling 06/13/2013 for an upper outer quadrant pT2 pN1, stage IIB invasive mammary carcinoma (either an invasive ductal carcinoma with lobular features, or a lobular carcinoma that was E-cadherin strongly positive), estrogen and progesterone receptor positive, HER-2 nonamplified, with a low MIB-1  (1) there was no indication for postmastectomy radiation  (2) the patient opted against adjuvant chemotherapy  (3) tamoxifen started September 2015, discontinued February 2022  (4) osteoporosis by bone density scan 09/18/2009  (5) questionable finding right rib cage:  (a) CT of the chest with contrast 08/17/2017 obtained to evaluate chest wall pain on the right was benign  (b) chest x-ray and right rib films on 10/01/2018 following a fall documented a healed fracture in the lateral right fourth rib no bone lesions otherwise  (c) bone scan 01/13/2020 obtained for evaluation of right rib pain showed questionable uptake at the posterior right eighth rib.  (d) chest x-ray  01/28/2020 showed degenerative changes of the thoracic spine  (e) review of all the above films by radiology (Dr. Tyron Russell) 03/03/2020 finds no definite healing fracture on the earlier films to explain the right eighth rib scintigraphic finding which is described as of questionable etiology and significance.  (f) repeat bone scan 08/15/2020 finds the previously questionable uptake in the right posterior rib not visualized; there is no evidence of bony metastatic disease   PLAN:   Ms. Strum is here for a follow-up with her daughter.  She denies any new or hip pain or abdominal pain but continues to have neck pain especially with any movement and is really concerned about the breast cancer returning in the lymph nodes in the neck causing the pain.  She continues on medication for osteoporosis, Tymlos every day and has been doing it for the past 10 months.  She denies any breast changes.  On physical exam, no palpable cervical adenopathy, no breast changes concerning for recurrence.  However since she is extremely worried about this, we have agreed to do an ultrasound of her neck.  I do believe her pain is likely mostly related to arthritic pain hence have recommended follow-up with orthopedics if she does not have any findings on the ultrasound and she expressed understanding.  She would like to return to clinic in 1 year.  Most recent mammogram in September 2023 with no evidence of malignancy. Total time spent: 30 minutes  *Total Encounter Time as defined by the Centers for Medicare and Medicaid Services includes, in addition to the face-to-face time of a patient visit (documented in the note above) non-face-to-face time: obtaining and reviewing outside history, ordering and reviewing medications, tests or procedures, care coordination (communications with other health care professionals or caregivers) and documentation in the medical record.

## 2022-05-12 ENCOUNTER — Ambulatory Visit (HOSPITAL_COMMUNITY)
Admission: RE | Admit: 2022-05-12 | Discharge: 2022-05-12 | Disposition: A | Discharge status: Home / Self Care | Payer: Medicare PPO | Source: Ambulatory Visit | Attending: Hematology and Oncology | Admitting: Hematology and Oncology

## 2022-05-12 DIAGNOSIS — R221 Localized swelling, mass and lump, neck: Secondary | ICD-10-CM | POA: Diagnosis not present

## 2022-05-12 DIAGNOSIS — C50411 Malignant neoplasm of upper-outer quadrant of right female breast: Secondary | ICD-10-CM | POA: Diagnosis not present

## 2022-05-12 DIAGNOSIS — Z170 Estrogen receptor positive status [ER+]: Secondary | ICD-10-CM | POA: Insufficient documentation

## 2022-05-18 ENCOUNTER — Telehealth: Payer: Self-pay | Admitting: *Deleted

## 2022-05-18 NOTE — Telephone Encounter (Addendum)
-----   Message from Rachel Moulds, MD sent at 05/16/2022 12:29 PM EDT ----- Can u let her know please.  Obtained identified VM- message left regarding NED per U/S.

## 2022-06-08 DIAGNOSIS — I10 Essential (primary) hypertension: Secondary | ICD-10-CM | POA: Diagnosis not present

## 2022-06-08 DIAGNOSIS — M810 Age-related osteoporosis without current pathological fracture: Secondary | ICD-10-CM | POA: Diagnosis not present

## 2022-06-08 DIAGNOSIS — N362 Urethral caruncle: Secondary | ICD-10-CM | POA: Diagnosis not present

## 2022-06-13 DIAGNOSIS — I10 Essential (primary) hypertension: Secondary | ICD-10-CM | POA: Diagnosis not present

## 2022-06-13 DIAGNOSIS — Z0000 Encounter for general adult medical examination without abnormal findings: Secondary | ICD-10-CM | POA: Diagnosis not present

## 2022-06-13 DIAGNOSIS — C50411 Malignant neoplasm of upper-outer quadrant of right female breast: Secondary | ICD-10-CM | POA: Diagnosis not present

## 2022-06-13 DIAGNOSIS — I2510 Atherosclerotic heart disease of native coronary artery without angina pectoris: Secondary | ICD-10-CM | POA: Diagnosis not present

## 2022-06-13 DIAGNOSIS — R7401 Elevation of levels of liver transaminase levels: Secondary | ICD-10-CM | POA: Diagnosis not present

## 2022-06-13 DIAGNOSIS — E875 Hyperkalemia: Secondary | ICD-10-CM | POA: Diagnosis not present

## 2022-06-13 DIAGNOSIS — M810 Age-related osteoporosis without current pathological fracture: Secondary | ICD-10-CM | POA: Diagnosis not present

## 2022-06-21 DIAGNOSIS — Z1211 Encounter for screening for malignant neoplasm of colon: Secondary | ICD-10-CM | POA: Diagnosis not present

## 2022-06-21 DIAGNOSIS — Z1212 Encounter for screening for malignant neoplasm of rectum: Secondary | ICD-10-CM | POA: Diagnosis not present

## 2022-06-27 LAB — COLOGUARD: COLOGUARD: NEGATIVE

## 2022-06-27 LAB — EXTERNAL GENERIC LAB PROCEDURE: COLOGUARD: NEGATIVE

## 2022-08-29 IMAGING — DX DG CHEST 2V
2 series · 2 of 2 positions shown · non-contrast
Comparison: 10/01/2018

CLINICAL DATA: 69-year-old female with recent falls

EXAM:
CHEST - 2 VIEW

[chest lat]
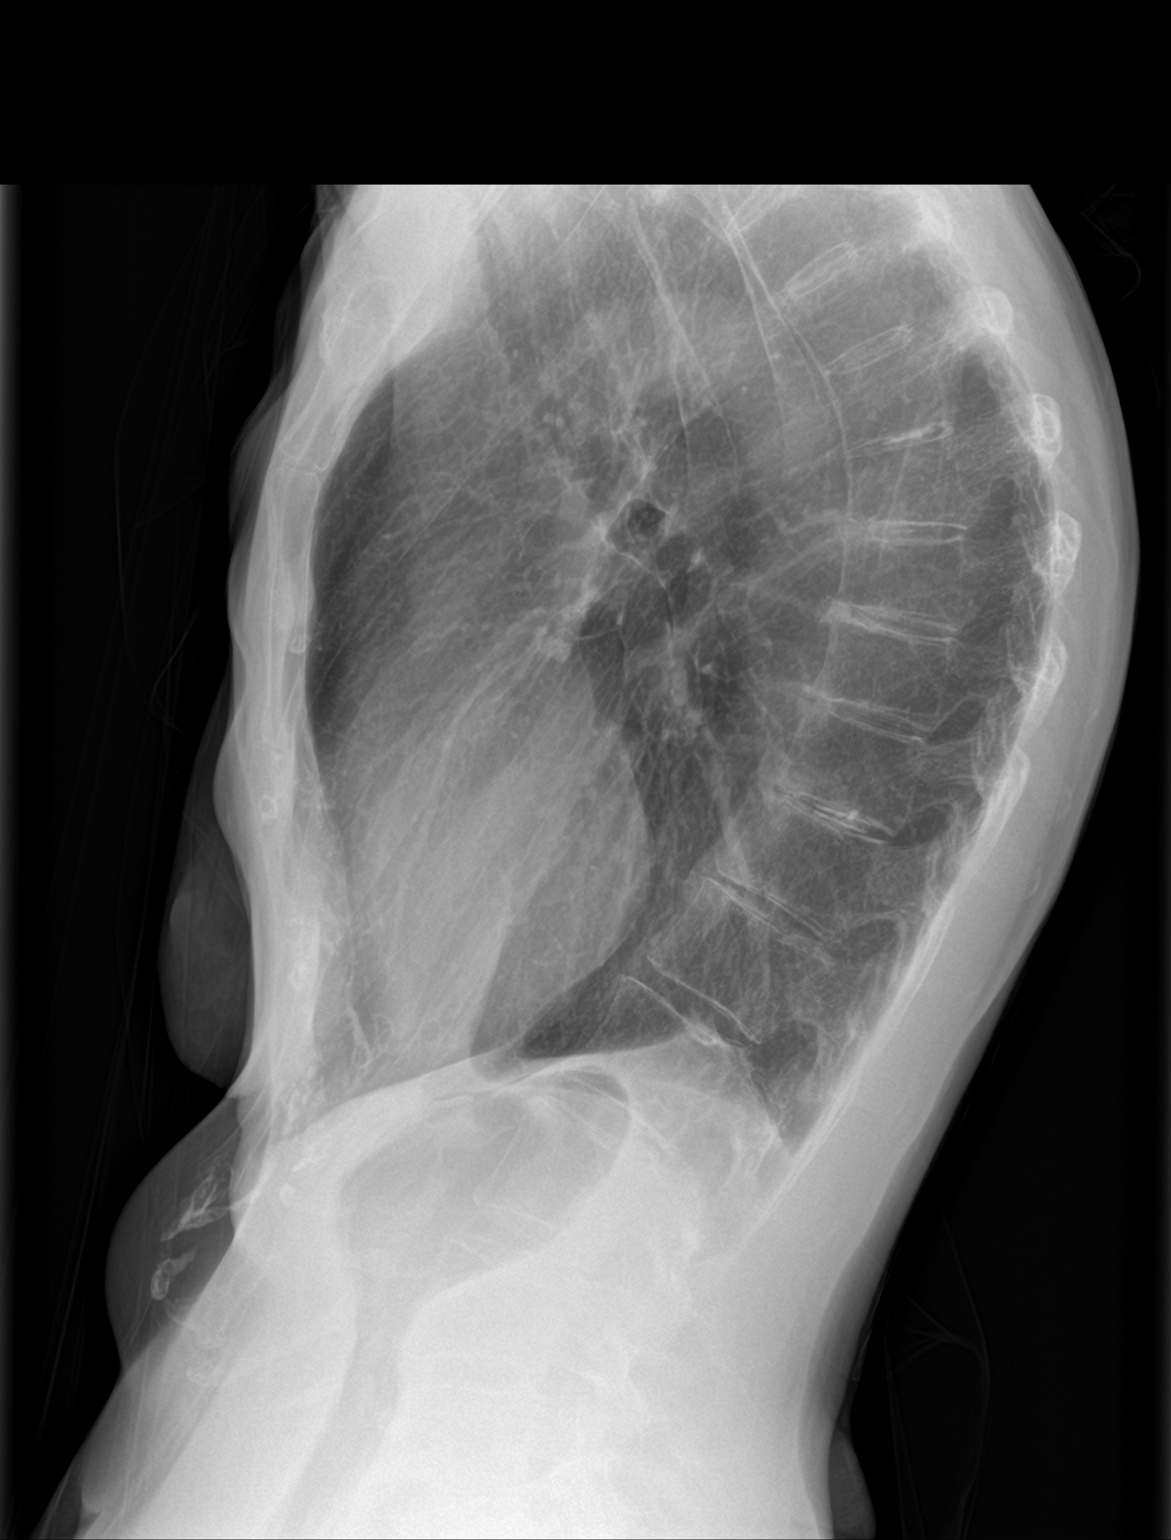

[chest pa]
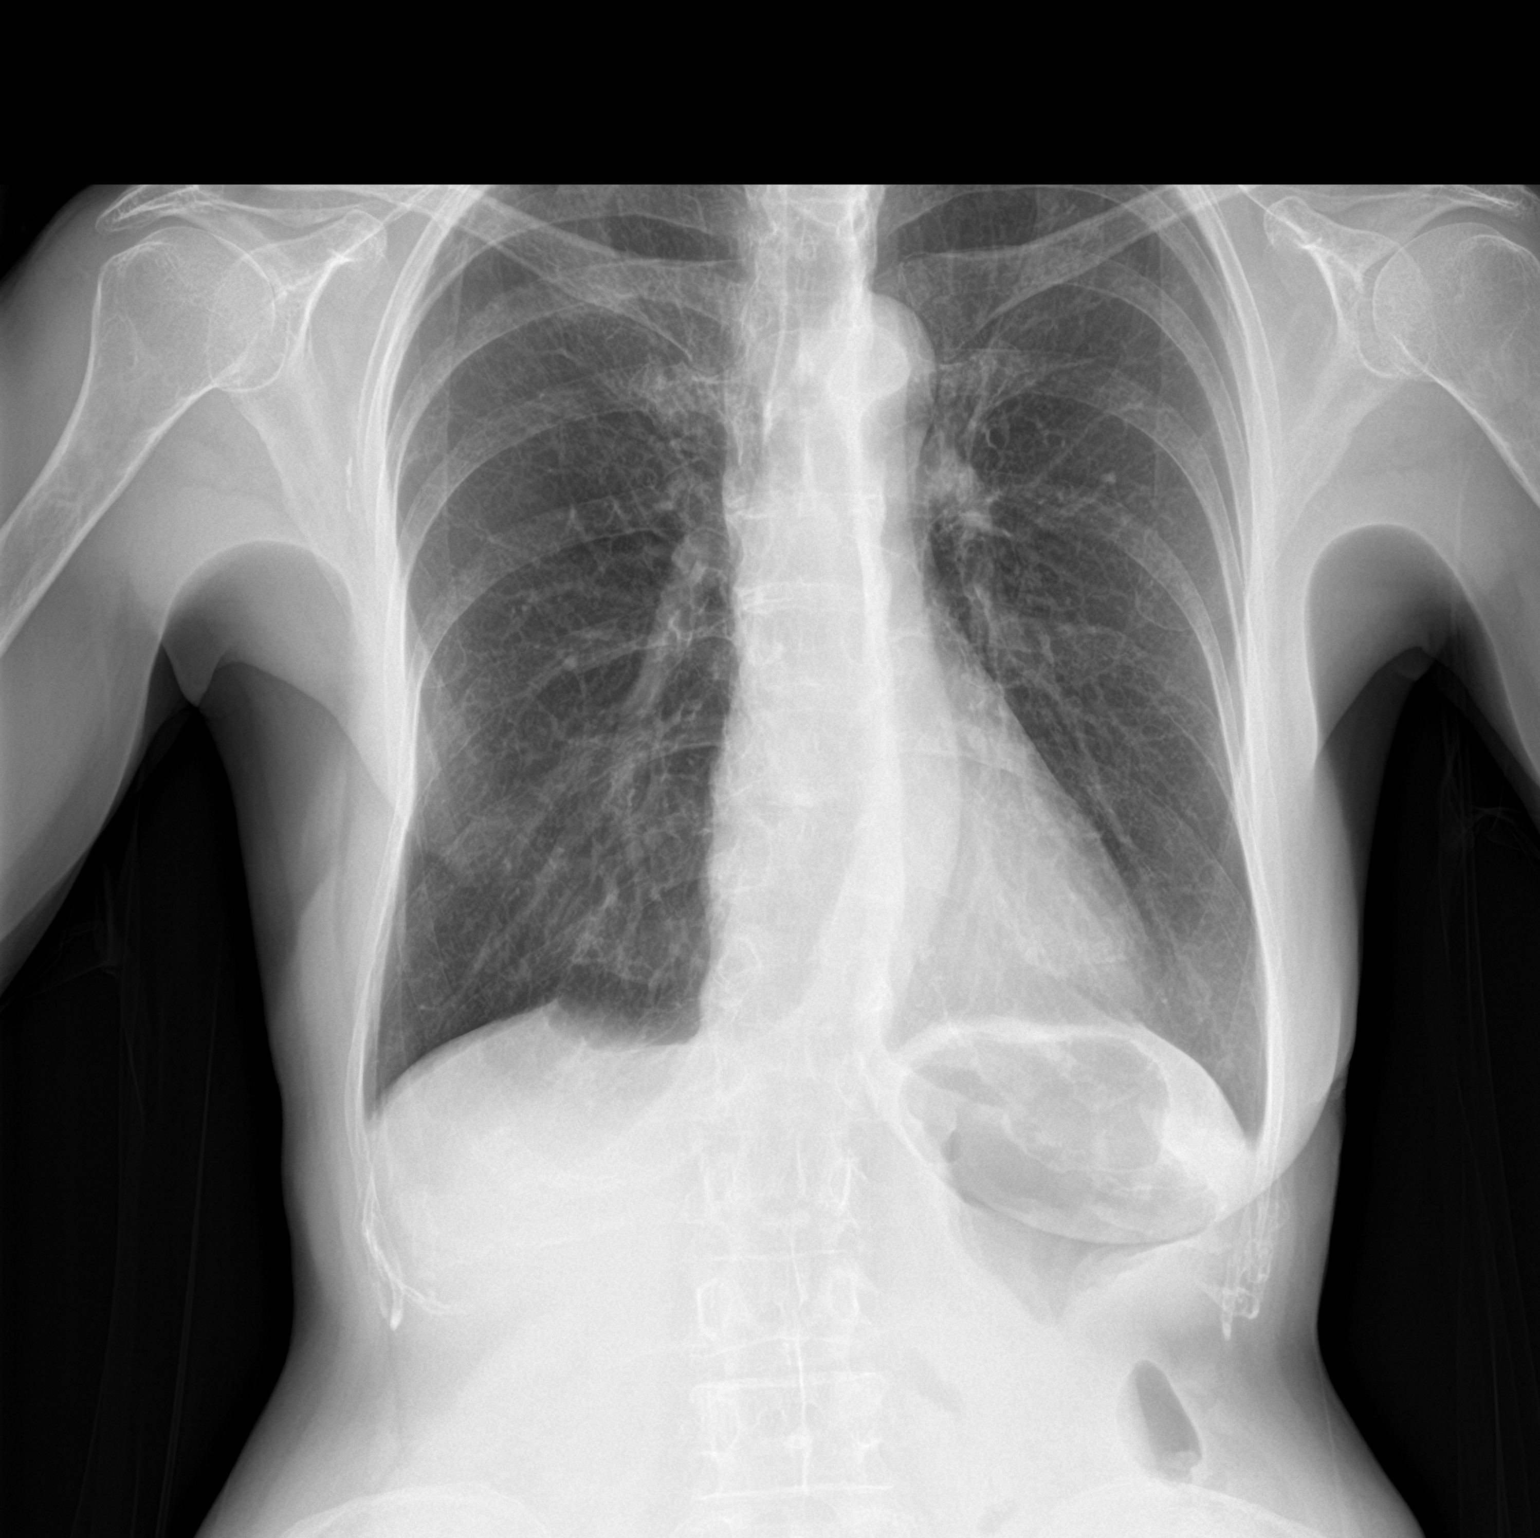

[2 of 2 positions shown; findings below may reference images not displayed]

FINDINGS: Cardiomediastinal silhouette within normal limits in size and
contour. No pneumothorax. No pleural effusion.

No confluent airspace disease.

Surgical changes of right mastectomy

Degenerative changes of the thoracic spine.
IMPRESSION: Negative for acute cardiopulmonary disease

## 2022-09-15 DIAGNOSIS — I10 Essential (primary) hypertension: Secondary | ICD-10-CM | POA: Diagnosis not present

## 2022-09-16 DIAGNOSIS — Z1231 Encounter for screening mammogram for malignant neoplasm of breast: Secondary | ICD-10-CM | POA: Diagnosis not present

## 2022-11-15 DIAGNOSIS — M810 Age-related osteoporosis without current pathological fracture: Secondary | ICD-10-CM | POA: Diagnosis not present

## 2022-12-03 ENCOUNTER — Encounter: Payer: Self-pay | Admitting: Emergency Medicine

## 2022-12-03 ENCOUNTER — Other Ambulatory Visit: Payer: Self-pay

## 2022-12-03 ENCOUNTER — Ambulatory Visit: Payer: No Typology Code available for payment source

## 2022-12-03 ENCOUNTER — Ambulatory Visit
Admission: EM | Admit: 2022-12-03 | Discharge: 2022-12-03 | Disposition: A | Discharge status: Home / Self Care | Payer: Medicare PPO

## 2022-12-03 DIAGNOSIS — S62632A Displaced fracture of distal phalanx of right middle finger, initial encounter for closed fracture: Secondary | ICD-10-CM

## 2022-12-03 DIAGNOSIS — S60031A Contusion of right middle finger without damage to nail, initial encounter: Secondary | ICD-10-CM | POA: Diagnosis not present

## 2022-12-03 NOTE — Discharge Instructions (Signed)
Ice, elevate, wear the finger splint for protection and take over-the-counter pain relievers as needed.  Follow-up with orthopedics for a recheck

## 2022-12-03 NOTE — ED Triage Notes (Addendum)
Pt reports right middle finger was smashed when trying to get garage door to come down this morning. Pt noted to have bruising and discoloration to middle finger. Denies pain to any other part of right hand/digits. Denies being on blood thinner.

## 2022-12-03 NOTE — ED Provider Notes (Signed)
[facility]    CSN: [id number] Arrival date & time: 12/03/22  1018      History   Chief Complaint Chief Complaint  Patient presents with   Finger Injury    HPI Breanna Nelson is a 72 y.o. female.   Presenting today with pain, bruising to the distal right middle finger after the garage door slammed onto it this morning.  States her pain is about a 7 or 8 out of 10 at this time.  Denies numbness, tingling, loss of range of motion to the rest of the finger and hand.  So for now try anything over-the-counter for symptoms.  Not currently on any anticoagulation.  No bleeding or broken skin    Past Medical History:  Diagnosis Date   Anxiety    Breast cancer of upper-outer quadrant of right female breast (HCC) dx'd 04/2013   mastectomy with lymph node removed,    Hypertension    Osteoporosis    PMB (postmenopausal bleeding)    last 3 months   Wears glasses    for reading    Patient Active Problem List   Diagnosis Date Noted   Age-related osteoporosis without current pathological fracture 10/08/2020   Atherosclerosis of coronary artery 10/08/2020   Blood donor 10/08/2020   Chiggers 10/08/2020   Encounter for general adult medical examination without abnormal findings 10/08/2020   Essential (primary) hypertension 10/08/2020   Osteoporosis without current pathological fracture 10/08/2020   Personal history of malignant neoplasm of breast 10/08/2020   Renal cyst 10/08/2020   Riedel's lobe of liver 10/08/2020   Post-menopausal bleeding 03/03/2020   S/P mastectomy 06/14/2013   Bleeding 06/13/2013   Malignant neoplasm of upper-outer quadrant of right breast in female, estrogen receptor positive (HCC) 05/10/2013    Past Surgical History:  Procedure Laterality Date   DILATATION & CURETTAGE/HYSTEROSCOPY WITH MYOSURE N/A 03/20/2020   Procedure: DILATATION & CURETTAGE/HYSTEROSCOPY WITH MYOSURE (SMALL SCOPE);  Surgeon: Zelphia Cairo, MD;  Location: [facility];  Service: Gynecology;  Laterality: N/A;   SIMPLE MASTECTOMY W/ SENTINEL NODE BIOPSY Right 06/13/13   by Dr. Lonia Chimera    OB History   No obstetric history on file.      Home Medications    Prior to Admission medications   Medication Sig Start Date End Date Taking? Authorizing Provider  Abaloparatide (TYMLOS [place]) Inject into the skin.   Yes [provider]  amLODipine (NORVASC) 5 MG tablet every evening. 07/14/19   [provider]  aspirin 81 MG chewable tablet  08/06/20   [provider]  Calcium 1200-1000 MG-UNIT CHEW Chew 1 capsule by mouth daily.    [provider]  calcium carbonate (OSCAL) 1500 (600 Ca) MG TABS tablet     [provider]  Cholecalciferol (VITAMIN D-3) 1000 UNITS CAPS Take 1 capsule by mouth daily.    [provider]  hydrocortisone 2.5 % cream Apply topically. 04/16/20   [provider]  Multiple Vitamin (MULTIVITAMIN) tablet Take 1 tablet by mouth daily.    [provider]  Multiple Vitamins-Minerals (CENTRUM WOMEN) TABS     [provider]  rosuvastatin (CRESTOR) 5 MG tablet Take 1 tablet (5 mg total) by mouth daily. 09/08/20   Magrinat, Valentino Hue, MD  vitamin C (ASCORBIC ACID) 500 MG tablet Take 1,000 mg by mouth daily.    [provider]    Family History Family History  Problem Relation Age of Onset   Skin cancer Mother  Testicular cancer Brother     Social History Social History   Tobacco Use   Smoking status: Never   Smokeless tobacco: Never  Vaping Use   Vaping status: Never Used  Substance Use Topics   Alcohol use: No   Drug use: Never     Allergies   Patient has no known allergies.   Review of Systems Review of Systems PER HPI  Physical Exam Triage Vital Signs ED Triage Vitals  Encounter Vitals Group     BP 12/03/22 1104 (!) 156/91     Systolic BP Percentile --      Diastolic BP Percentile --      Pulse Rate 12/03/22  1104 77     Resp 12/03/22 1104 20     Temp 12/03/22 1104 98 F (36.7 C)     Temp Source 12/03/22 1104 Oral     SpO2 12/03/22 1104 98 %     Weight --      Height --      Head Circumference --      Peak Flow --      Pain Score 12/03/22 1102 9     Pain Loc --      Pain Education --      Exclude from Growth Chart --    No data found.  Updated Vital Signs BP (!) 156/91 (BP Location: Right Arm)   Pulse 77   Temp 98 F (36.7 C) (Oral)   Resp 20   SpO2 98%   Visual Acuity Right Eye Distance:   Left Eye Distance:   Bilateral Distance:    Right Eye Near:   Left Eye Near:    Bilateral Near:     Physical Exam Vitals and nursing note reviewed.  Constitutional:      Appearance: Normal appearance. She is not ill-appearing.  HENT:     Head: Atraumatic.  Eyes:     Extraocular Movements: Extraocular movements intact.     Conjunctiva/sclera: Conjunctivae normal.  Cardiovascular:     Rate and Rhythm: Normal rate and regular rhythm.     Heart sounds: Normal heart sounds.  Pulmonary:     Effort: Pulmonary effort is normal.     Breath sounds: Normal breath sounds.  Musculoskeletal:        General: Swelling, tenderness and signs of injury present. Normal range of motion.     Cervical back: Normal range of motion and neck supple.     Comments: Distal right middle finger erythematous, edematous, bruised.  Minimal range of motion to the middle finger.   Skin:    General: Skin is warm and dry.     Findings: Bruising present.  Neurological:     Mental Status: She is alert and oriented to person, place, and time.     Motor: No weakness.     Gait: Gait normal.     Comments: Right upper extremity neurovascularly intact  Psychiatric:        Mood and Affect: Mood normal.        Thought Content: Thought content normal.        Judgment: Judgment normal.    UC Treatments / Results  Labs (all labs ordered are listed, but only abnormal results are displayed) Labs Reviewed - No data to  display  EKG   Radiology DG Finger Middle Right  Result Date: 12/03/2022 CLINICAL DATA:  Right middle finger pain and bruising. EXAM: RIGHT MIDDLE FINGER 2+V COMPARISON:  None Available. FINDINGS: Minimally displaced fracture is  seen involving the distal portion of the third distal phalanx. Joint spaces are intact. IMPRESSION: Minimally displaced third distal phalangeal fracture. Electronically Signed   By: Lupita Raider M.D.   On: 12/03/2022 11:44    Procedures Procedures (including critical care time)  Medications Ordered in UC Medications - No data to display  Initial Impression / Assessment and Plan / UC Course  I have reviewed the triage vital signs and the nursing notes.  Pertinent labs & imaging results that were available during my care of the patient were reviewed by me and considered in my medical decision making (see chart for details).     X-ray showing a minimally displaced distal third finger fracture on the right hand.  Placed in a finger splint today, RICE protocol reviewed, offered pain medication but patient declines at this time.  Ortho follow-up next week.  Final Clinical Impressions(s) / UC Diagnoses   Final diagnoses:  Closed displaced fracture of distal phalanx of right middle finger, initial encounter     Discharge Instructions      Ice, elevate, wear the finger splint for protection and take over-the-counter pain relievers as needed.  Follow-up with orthopedics for a recheck    ED Prescriptions   None    PDMP not reviewed this encounter.   Particia Nearing, [place] 12/03/22 1215

## 2023-02-15 DIAGNOSIS — Q4479 Other congenital malformations of liver: Secondary | ICD-10-CM | POA: Diagnosis not present

## 2023-02-15 DIAGNOSIS — R140 Abdominal distension (gaseous): Secondary | ICD-10-CM | POA: Diagnosis not present

## 2023-02-15 DIAGNOSIS — R1084 Generalized abdominal pain: Secondary | ICD-10-CM | POA: Diagnosis not present

## 2023-03-01 DIAGNOSIS — R109 Unspecified abdominal pain: Secondary | ICD-10-CM | POA: Diagnosis not present

## 2023-03-01 DIAGNOSIS — K760 Fatty (change of) liver, not elsewhere classified: Secondary | ICD-10-CM | POA: Diagnosis not present

## 2023-03-27 ENCOUNTER — Encounter: Payer: Self-pay | Admitting: Physician Assistant

## 2023-05-01 ENCOUNTER — Inpatient Hospital Stay
Payer: No Typology Code available for payment source | Attending: Hematology and Oncology | Admitting: Hematology and Oncology

## 2023-05-01 VITALS — BP 147/81 | HR 71 | Temp 98.0°F | Resp 15 | Wt 117.5 lb

## 2023-05-01 DIAGNOSIS — R531 Weakness: Secondary | ICD-10-CM | POA: Insufficient documentation

## 2023-05-01 DIAGNOSIS — C50411 Malignant neoplasm of upper-outer quadrant of right female breast: Secondary | ICD-10-CM | POA: Diagnosis not present

## 2023-05-01 DIAGNOSIS — Z853 Personal history of malignant neoplasm of breast: Secondary | ICD-10-CM | POA: Diagnosis not present

## 2023-05-01 DIAGNOSIS — Z170 Estrogen receptor positive status [ER+]: Secondary | ICD-10-CM | POA: Diagnosis not present

## 2023-05-01 NOTE — Progress Notes (Signed)
 [facility]  [phone] Fax:([phone]     ID: Breanna Nelson DOB: 28-Sep-1950  MR#: [id number]  CSN#:[id number]  Patient Care Team: Imelda Man, MD as PCP - General (Internal Medicine) Magrinat, Rozella Cornfield, MD (Inactive) as Consulting Physician (Oncology) Sim Dryer, MD as Consulting Physician (General Surgery) Retta Caster, MD as Consulting Physician (Radiation Oncology) Ashby Lawman, MD as Consulting Physician (Obstetrics and Gynecology) OTHER MD: Dr Shirl Dose, surgery   CHIEF COMPLAINT: Estrogen receptor positive breast cancer (s/p right mastectomy)  CURRENT TREATMENT: Observation  INTERVAL HISTORY:  "Breanna Nelson" returns today for follow-up of her estrogen receptor positive breast cancer.  Discussed the use of AI scribe software for clinical note transcription with the patient, who gave verbal consent to proceed.  History of Present Illness Breanna Nelson is a 73 year old female with severe bone loss and breast cancer who presents with weakness and stomach issues.  She has experienced significant weakness over the past week, with the most severe episode occurring on Saturday. She describes feeling as though she 'just didn't think I was going to make it.' She is unsure of the cause but mentions a potential concern about her heart due to experiencing pain in that area. No recent cardiac workup has been done.  She has been experiencing stomach issues that affect her ability to eat, leading to changes in her bowel movements. She notes that she cannot eat what she used to and has been trying to manage her diet accordingly. She has experienced weight loss, dropping from 150 pounds to 117 pounds, partly due to dietary changes to manage high glucose levels. She has reduced her intake of sweets, including giving up ice cream, and has lost about ten pounds over the past seven to eight weeks.  She has a history of severe bone loss and has been on  Tymlos for two years to help rebuild her bones. She is nearing the end of this treatment and anticipates discontinuing it in two months. A follow-up bone density test is planned to assess the treatment's effectiveness.  She has a history of breast cancer and underwent a mammogram last year, which was normal. She reports no new changes or concerns from a breast cancer standpoint.  No significant cough or shortness of breath. Occasional leg swelling and pain under the arm and up the back, which she attributes to asymmetry and weight loss. She is able to walk without difficulty.     COVID 19 VACCINATION STATUS:      BREAST CANCER HISTORY: From the original intake note:  The patient herself noted a lump in her right breast late April 20 15th. She brought it to her primary care physician's attention and on 05/06/2013 had a bilateral mammography with tomography at [facility]. The breast density was category C. There was a new area of architectural distortion at the 11:00 position of the right breast with no other significant findings. Accordingly a right breast ultrasound was performed the same day. This identified a 1 cm irregular mass in the right breast at the 10:00 position. This correlated well with the palpable findings and biopsy of the mass was obtained the same day. This showed (SAA [phone]) and invasive mammary carcinoma, with strong diffuse expression of E-cadherin both an invasive and in situ carcinomas, supporting a ductal etiology. Estrogen receptor was 100% positive, progesterone receptor was 96% positive, with strong staining intensity, the MIB-1 was 3%, and there was no HER-2 amplification, the signals ratio being 1.3 and  the number per cell 2.5.  The patient underwent bilateral breast MRI 05/13/2013. This confirmed, in the upper outer quadrant of the right breast, several small nodules, associated with an area of postbiopsy hematoma. Taking together the area of enhancement and hematoma the  largest measuring was 1.8 cm. There were no masses or abnormal enhancement seen in the left breast and no suspicious lymph nodes.  The patient was seen in the multidisciplinary clinic by surgery and radiation oncology 05/15/2013. After discussion here and also with Dr. Annette Barters, and with a good understanding of the equivalent results between mastectomy and breast conservation surgery plus radiation, the patient opted for mastectomy (without planned reconstruction) and sentinel lymph node sampling. This was performed 06/13/2013 at [facility]. There was some postoperative bleeding requiring evacuation of a hematoma. The final pathology ([id number]) showed a 1.5 cm invasive mammary carcinoma (either at lobular breast cancer with the unusual feature of being E-cadherin positive, or a ductal breast cancer with lobular features), grade 2, with a positive intramammary lymph node. The sentinel lymph node and 4 additional axillary lymph nodes were negative. The tumor was again strongly estrogen and progesterone receptor positive, again HER-2 negative by FISH, with a low MIB-1-1, not read as 10%. Margins were negative.  On 07/03/2013 the patient was reevaluated by radiation oncology at they agreed that postmastectomy radiation was not indicated. They suggested consultation to medical oncology to consider adjuvant hormonal therapy.  The patient's subsequent history is as detailed below.   PAST MEDICAL HISTORY: Past Medical History:  Diagnosis Date   Anxiety    Breast cancer of upper-outer quadrant of right female breast (HCC) dx'd 04/2013   mastectomy with lymph node removed,    Hypertension    Osteoporosis    PMB (postmenopausal bleeding)    last 3 months   Wears glasses    for reading    PAST SURGICAL HISTORY: Past Surgical History:  Procedure Laterality Date   DILATATION & CURETTAGE/HYSTEROSCOPY WITH MYOSURE N/A 03/20/2020   Procedure: DILATATION & CURETTAGE/HYSTEROSCOPY WITH MYOSURE (SMALL  SCOPE);  Surgeon: Ashby Lawman, MD;  Location: [facility];  Service: Gynecology;  Laterality: N/A;   SIMPLE MASTECTOMY W/ SENTINEL NODE BIOPSY Right 06/13/13   by Dr. Delsie Figures    FAMILY HISTORY Family History  Problem Relation Age of Onset   Skin cancer Mother    Testicular cancer Brother    the patient's father died at the age of 16. The patient's mother died at the age of 82 from heart disease. She also had Alzheimer's disease. The patient had one brother who died in the age of 60 with testicular cancer. One sister died as a baby. There is no history of breast or ovarian cancer in the family.   GYNECOLOGIC HISTORY:  No LMP recorded. Patient is postmenopausal. Menarche age 20, first live birth age 51. The patient is GX P2. She does not recall when she went through menopause, but he was more than 10 years ago. She never took hormone replacement. She did take birth control pills for a brief period, with no complications   SOCIAL HISTORY:  Breanna Nelson works at [employer]. At home it's just she and her dog [name]. Her husband died from complications of lymphoma. He also had undergone a colectomy in 2004. "He had more than 200 polyps". The patient's daughter Theodosia Fishman lives in [place] where she works as a Emergency planning/management officer. She also does [employer]. Her son Troyce Meachem is a truck driver.  He  recently underwent colonoscopy.  He lives near [place], in [place].  The patient has 4 grandchildren. The patient is a [employer].--[name] tells me she is fianced to Mr. Rowe Coots, who is a church member with her.  Unfortunately he has significant COPD problems and is on oxygen.  That is why she was tested for coronavirus earlier this year.    ADVANCED DIRECTIVES: Not in place; the patient intends to name her daughter Theodosia Fishman as her healthcare power of attorney.   HEALTH MAINTENANCE: Social History   Tobacco Use   Smoking status: Never   Smokeless tobacco: Never  Vaping Use    Vaping status: Never Used  Substance Use Topics   Alcohol use: No   Drug use: Never      No Known Allergies  Current Outpatient Medications  Medication Sig Dispense Refill   Abaloparatide (TYMLOS [place]) Inject into the skin.     amLODipine (NORVASC) 5 MG tablet every evening.     aspirin 81 MG chewable tablet      Calcium 1200-1000 MG-UNIT CHEW Chew 1 capsule by mouth daily.     calcium carbonate (OSCAL) 1500 (600 Ca) MG TABS tablet      Cholecalciferol (VITAMIN D-3) 1000 UNITS CAPS Take 1 capsule by mouth daily.     hydrocortisone 2.5 % cream Apply topically.     Multiple Vitamin (MULTIVITAMIN) tablet Take 1 tablet by mouth daily.     Multiple Vitamins-Minerals (CENTRUM WOMEN) TABS      rosuvastatin (CRESTOR) 5 MG tablet Take 1 tablet (5 mg total) by mouth daily.     vitamin C (ASCORBIC ACID) 500 MG tablet Take 1,000 mg by mouth daily.     No current facility-administered medications for this visit.    OBJECTIVE: White woman who appears stated age  Vitals:   05/01/23 0946  BP: (!) 147/81  Pulse: 71  Resp: 15  Temp: 98 F (36.7 C)  SpO2: 100%     Body mass index is 18.96 kg/m.    ECOG FS:1 - Symptomatic but completely ambulatory  Physical Exam Constitutional:      Appearance: Normal appearance.  Cardiovascular:     Rate and Rhythm: Normal rate and regular rhythm.  Chest:     Comments: Right breast status postmastectomy.  Left breast palpated.  No area of recurrence.  No regional lymphadenopathy.  No cervical lymphadenopathy. Abdominal:     General: Abdomen is flat.     Palpations: Abdomen is soft.  Musculoskeletal:     Cervical back: Normal range of motion and neck supple. No rigidity.  Lymphadenopathy:     Cervical: No cervical adenopathy.  Neurological:     Mental Status: She is alert.      LAB RESULTS:  CMP     Component Value Date/Time   NA 140 04/27/2021 1611   NA 139 09/12/2016 1235   K 3.9 04/27/2021 1611   K 3.5 09/12/2016 1235   CL 101  04/27/2021 1611   CO2 33 (H) 04/27/2021 1611   CO2 28 09/12/2016 1235   GLUCOSE 109 (H) 04/27/2021 1611   GLUCOSE 118 09/12/2016 1235   BUN 11 04/27/2021 1611   BUN 11.9 09/12/2016 1235   CREATININE 0.70 04/27/2021 1611   CREATININE 0.8 09/12/2016 1235   CALCIUM 9.4 04/27/2021 1611   CALCIUM 8.9 09/12/2016 1235   PROT 7.4 04/27/2021 1611   PROT 6.4 09/12/2016 1235   ALBUMIN 4.4 04/27/2021 1611   ALBUMIN 3.6 09/12/2016 1235   AST 31 04/27/2021  1611   AST 25 09/12/2016 1235   ALT 22 04/27/2021 1611   ALT 15 09/12/2016 1235   ALKPHOS 82 04/27/2021 1611   ALKPHOS 53 09/12/2016 1235   BILITOT 0.5 04/27/2021 1611   BILITOT 0.32 09/12/2016 1235   GFRNONAA >60 04/27/2021 1611   GFRAA >60 09/27/2019 1330    No results found for: "TOTALPROTELP", "ALBUMINELP", "A1GS", "A2GS", "BETS", "BETA2SER", "GAMS", "MSPIKE", "SPEI"  Lab Results  Component Value Date   WBC 6.9 04/27/2021   NEUTROABS 4.9 04/27/2021   HGB 14.1 04/27/2021   HCT 42.4 04/27/2021   MCV 93.8 04/27/2021   PLT 208 04/27/2021    No results found for: "LABCA2"  No components found for: "YNWGNF621"  No results for input(s): "INR" in the last 168 hours.  No results found for: "LABCA2"  No results found for: "HYQ657"  No results found for: "CAN125"  No results found for: "CAN153"  No results found for: "CA2729"  No components found for: "HGQUANT"  No results found for: "CEA1", "CEA" / No results found for: "CEA1", "CEA"   No results found for: "AFPTUMOR"  No results found for: "CHROMOGRNA"  No results found for: "KPAFRELGTCHN", "LAMBDASER", "KAPLAMBRATIO" (kappa/lambda light chains)  No results found for: "HGBA", "HGBA2QUANT", "HGBFQUANT", "HGBSQUAN" (Hemoglobinopathy evaluation)   No results found for: "LDH"  No results found for: "IRON", "TIBC", "IRONPCTSAT" (Iron and TIBC)  Lab Results  Component Value Date   FERRITIN 19 09/07/2017    Urinalysis No results found for: "COLORURINE",  "APPEARANCEUR", "LABSPEC", "PHURINE", "GLUCOSEU", "HGBUR", "BILIRUBINUR", "KETONESUR", "PROTEINUR", "UROBILINOGEN", "NITRITE", "LEUKOCYTESUR"   STUDIES: No results found.   ASSESSMENT: 73 y.o. [place] woman status post right mastectomy and sentinel lymph node sampling 06/13/2013 for an upper outer quadrant pT2 pN1, stage IIB invasive mammary carcinoma (either an invasive ductal carcinoma with lobular features, or a lobular carcinoma that was E-cadherin strongly positive), estrogen and progesterone receptor positive, HER-2 nonamplified, with a low MIB-1  (1) there was no indication for postmastectomy radiation  (2) the patient opted against adjuvant chemotherapy  (3) tamoxifen  started September 2015, discontinued February 2022  (4) osteoporosis by bone density scan 09/18/2009  (5) questionable finding right rib cage:  (a) CT of the chest with contrast 08/17/2017 obtained to evaluate chest wall pain on the right was benign  (b) chest x-ray and right rib films on 10/01/2018 following a fall documented a healed fracture in the lateral right fourth rib no bone lesions otherwise  (c) bone scan 01/13/2020 obtained for evaluation of right rib pain showed questionable uptake at the posterior right eighth rib.  (d) chest x-ray 01/28/2020 showed degenerative changes of the thoracic spine  (e) review of all the above films by radiology (Dr. Margherita Shell) 03/03/2020 finds no definite healing fracture on the earlier films to explain the right eighth rib scintigraphic finding which is described as of questionable etiology and significance.  (f) repeat bone scan 08/15/2020 finds the previously questionable uptake in the right posterior rib not visualized; there is no evidence of bony metastatic disease   PLAN:  Assessment and Plan Assessment & Plan Breast cancer No new concerning changes. Recent mammogram normal. Pain likely due to asymmetry and weight loss. - Schedule next mammogram in August. - Advise  to call if any new symptoms arise.  Severe bone loss Treated with Tymlos for nearly two years. Nearing end of treatment. Bone density test scheduled to assess effectiveness. - Continue Tymlos for two more months. - Schedule bone density test.  Weight loss Significant weight  loss from 150 to 117 pounds, intentional. Reduced sugar intake may contribute. February blood work normal. - Monitor weight and dietary intake. - Consider follow-up with primary care for further evaluation if needed.  Total time spent: 30 minutes  *Total Encounter Time as defined by the Centers for Medicare and Medicaid Services includes, in addition to the face-to-face time of a patient visit (documented in the note above) non-face-to-face time: obtaining and reviewing outside history, ordering and reviewing medications, tests or procedures, care coordination (communications with other health care professionals or caregivers) and documentation in the medical record.

## 2023-05-03 DIAGNOSIS — R072 Precordial pain: Secondary | ICD-10-CM | POA: Diagnosis not present

## 2023-05-03 DIAGNOSIS — R531 Weakness: Secondary | ICD-10-CM | POA: Diagnosis not present

## 2023-05-03 DIAGNOSIS — R7303 Prediabetes: Secondary | ICD-10-CM | POA: Diagnosis not present

## 2023-05-18 ENCOUNTER — Ambulatory Visit: Admitting: Physician Assistant

## 2023-06-12 DIAGNOSIS — M810 Age-related osteoporosis without current pathological fracture: Secondary | ICD-10-CM | POA: Diagnosis not present

## 2023-06-12 DIAGNOSIS — I10 Essential (primary) hypertension: Secondary | ICD-10-CM | POA: Diagnosis not present

## 2023-06-15 DIAGNOSIS — I2510 Atherosclerotic heart disease of native coronary artery without angina pectoris: Secondary | ICD-10-CM | POA: Diagnosis not present

## 2023-06-15 DIAGNOSIS — M810 Age-related osteoporosis without current pathological fracture: Secondary | ICD-10-CM | POA: Diagnosis not present

## 2023-06-15 DIAGNOSIS — Q4479 Other congenital malformations of liver: Secondary | ICD-10-CM | POA: Diagnosis not present

## 2023-06-15 DIAGNOSIS — I10 Essential (primary) hypertension: Secondary | ICD-10-CM | POA: Diagnosis not present

## 2023-06-15 DIAGNOSIS — Z0000 Encounter for general adult medical examination without abnormal findings: Secondary | ICD-10-CM | POA: Diagnosis not present

## 2023-06-15 DIAGNOSIS — R634 Abnormal weight loss: Secondary | ICD-10-CM | POA: Diagnosis not present

## 2023-06-15 DIAGNOSIS — R7309 Other abnormal glucose: Secondary | ICD-10-CM | POA: Diagnosis not present

## 2023-07-14 DIAGNOSIS — M810 Age-related osteoporosis without current pathological fracture: Secondary | ICD-10-CM | POA: Diagnosis not present

## 2023-08-08 ENCOUNTER — Ambulatory Visit: Attending: Cardiology | Admitting: Cardiology

## 2023-08-08 ENCOUNTER — Encounter: Payer: Self-pay | Admitting: Cardiology

## 2023-08-08 VITALS — BP 132/84 | HR 65 | Ht 66.5 in | Wt 117.0 lb

## 2023-08-08 DIAGNOSIS — I2510 Atherosclerotic heart disease of native coronary artery without angina pectoris: Secondary | ICD-10-CM | POA: Diagnosis not present

## 2023-08-08 DIAGNOSIS — R079 Chest pain, unspecified: Secondary | ICD-10-CM | POA: Diagnosis not present

## 2023-08-08 NOTE — Progress Notes (Signed)
 Clinical Summary Breanna Nelson is a 73 y.o.female seen today as a new consult, referred by Dr Clarice for the following medical problems.  1.Chest pain - midchest, pressure like feeling. 3/10 in severity. Cannot recall if occurred with rest or with activity. No other associated symptoms. Would last just a few seconds. Not positional. Was occurring about twice a week, but last episode. Last episode was late April.  - walks 20 minutes 5 [place], no chest pain - generalized fatigue with activities that can last several hours.       Past Medical History:  Diagnosis Date   Anxiety    Breast cancer of upper-outer quadrant of right female breast (HCC) dx'd 04/2013   mastectomy with lymph node removed,    Hypertension    Osteoporosis    PMB (postmenopausal bleeding)    last 3 months   Wears glasses    for reading     No Known Allergies   Current Outpatient Medications  Medication Sig Dispense Refill   Abaloparatide (TYMLOS [place]) Inject into the skin.     amLODipine (NORVASC) 5 MG tablet every evening.     aspirin 81 MG chewable tablet      Calcium 1200-1000 MG-UNIT CHEW Chew 1 capsule by mouth daily.     calcium carbonate (OSCAL) 1500 (600 Ca) MG TABS tablet      Cholecalciferol (VITAMIN D-3) 1000 UNITS CAPS Take 1 capsule by mouth daily.     hydrocortisone 2.5 % cream Apply topically.     Multiple Vitamin (MULTIVITAMIN) tablet Take 1 tablet by mouth daily.     Multiple Vitamins-Minerals (CENTRUM WOMEN) TABS      rosuvastatin (CRESTOR) 5 MG tablet Take 1 tablet (5 mg total) by mouth daily.     vitamin C (ASCORBIC ACID) 500 MG tablet Take 1,000 mg by mouth daily.     No current facility-administered medications for this visit.     Past Surgical History:  Procedure Laterality Date   DILATATION & CURETTAGE/HYSTEROSCOPY WITH MYOSURE N/A 03/20/2020   Procedure: DILATATION & CURETTAGE/HYSTEROSCOPY WITH MYOSURE (SMALL SCOPE);  Surgeon: Latisha Medford, MD;   Location: [facility];  Service: Gynecology;  Laterality: N/A;   SIMPLE MASTECTOMY W/ SENTINEL NODE BIOPSY Right 06/13/13   by Dr. Jayson Rigg     No Known Allergies    Family History  Problem Relation Age of Onset   Skin cancer Mother    Testicular cancer Brother      Social History Ms. Silvey reports that she has never smoked. She has never used smokeless tobacco. Ms. Aleshire reports no history of alcohol use.    Physical Examination Today's Vitals   08/08/23 1320  BP: 132/84  Pulse: 65  SpO2: 100%  Weight: 117 lb (53.1 kg)  Height: 5' 6.5 (1.689 m)  PainSc: 0-No pain   Body mass index is 18.6 kg/m.  Gen: resting comfortably, no acute distress HEENT: no scleral icterus, pupils equal round and reactive, no palptable cervical adenopathy,  CV: RRR, no m/rg, no jvd Resp: Clear to auscultation bilaterally GI: abdomen is soft, non-tender, non-distended, normal bowel sounds, no hepatosplenomegaly MSK: extremities are warm, no edema.  Skin: warm, no rash Neuro:  no focal deficits Psych: appropriate affect     Assessment and Plan  1.Chest pain - prior atypical chest pain that has resolved with last episode over 3 months ago - she has not current cardiopulmonary symptoms. Generalized fatigue lasting hours at a  time during the day would not be a cardiac related symptom. She specifically denies any SOB/DOE and walks up to 20 minutes multiple times a day.  - in absence of specific cardiopulmonary symptoms no indication for cardiac testing at this time. Defer evaluation for generalized fatigue and weakness to primary. If develops new cardiopulmonary symptoms could reevaluate - EKG today shows NSR, no acute ischemic changes  2.Coronary atherosclerosis - no symptoms - continue ASA, statin, risk factor modification   F/u as needed      Breanna Nelson, M.D.

## 2023-08-08 NOTE — Patient Instructions (Signed)
 Medication Instructions:  Your physician recommends that you continue on your current medications as directed. Please refer to the Current Medication list given to you today.   Labwork: None today  Testing/Procedures: None today  Follow-Up: As needed  Any Other Special Instructions Will Be Listed Below (If Applicable).  If you need a refill on your cardiac medications before your next appointment, please call your pharmacy.

## 2023-08-22 DIAGNOSIS — H35033 Hypertensive retinopathy, bilateral: Secondary | ICD-10-CM | POA: Diagnosis not present

## 2023-08-22 DIAGNOSIS — H04123 Dry eye syndrome of bilateral lacrimal glands: Secondary | ICD-10-CM | POA: Diagnosis not present

## 2023-08-22 DIAGNOSIS — H524 Presbyopia: Secondary | ICD-10-CM | POA: Diagnosis not present

## 2023-08-22 DIAGNOSIS — H26493 Other secondary cataract, bilateral: Secondary | ICD-10-CM | POA: Diagnosis not present

## 2023-08-22 DIAGNOSIS — H35363 Drusen (degenerative) of macula, bilateral: Secondary | ICD-10-CM | POA: Diagnosis not present

## 2023-08-22 DIAGNOSIS — H16223 Keratoconjunctivitis sicca, not specified as Sjogren's, bilateral: Secondary | ICD-10-CM | POA: Diagnosis not present

## 2023-08-24 DIAGNOSIS — M810 Age-related osteoporosis without current pathological fracture: Secondary | ICD-10-CM | POA: Diagnosis not present

## 2023-08-24 DIAGNOSIS — I10 Essential (primary) hypertension: Secondary | ICD-10-CM | POA: Diagnosis not present

## 2023-09-06 ENCOUNTER — Ambulatory Visit: Admitting: Cardiology

## 2023-09-11 DIAGNOSIS — H26492 Other secondary cataract, left eye: Secondary | ICD-10-CM | POA: Diagnosis not present

## 2023-09-22 DIAGNOSIS — Z1231 Encounter for screening mammogram for malignant neoplasm of breast: Secondary | ICD-10-CM | POA: Diagnosis not present

## 2023-09-28 DIAGNOSIS — I10 Essential (primary) hypertension: Secondary | ICD-10-CM | POA: Diagnosis not present

## 2023-09-28 DIAGNOSIS — M810 Age-related osteoporosis without current pathological fracture: Secondary | ICD-10-CM | POA: Diagnosis not present

## 2023-10-23 DIAGNOSIS — M810 Age-related osteoporosis without current pathological fracture: Secondary | ICD-10-CM | POA: Diagnosis not present

## 2024-05-02 ENCOUNTER — Ambulatory Visit: Admitting: Hematology and Oncology
